# Patient Record
Sex: Male | Born: 1935
Health system: Southern US, Community
[De-identification: ages and names within clinical notes are randomized; demographics above are authoritative.]

## PROBLEM LIST (undated history)

## (undated) DIAGNOSIS — I10 Essential (primary) hypertension: Secondary | ICD-10-CM

## (undated) DIAGNOSIS — N400 Enlarged prostate without lower urinary tract symptoms: Secondary | ICD-10-CM

## (undated) DIAGNOSIS — H349 Unspecified retinal vascular occlusion: Secondary | ICD-10-CM

## (undated) DIAGNOSIS — Z973 Presence of spectacles and contact lenses: Secondary | ICD-10-CM

## (undated) DIAGNOSIS — Z974 Presence of external hearing-aid: Secondary | ICD-10-CM

## (undated) DIAGNOSIS — M1990 Unspecified osteoarthritis, unspecified site: Secondary | ICD-10-CM

## (undated) DIAGNOSIS — Z972 Presence of dental prosthetic device (complete) (partial): Secondary | ICD-10-CM

## (undated) HISTORY — PX: COLONOSCOPY: SHX174

## (undated) HISTORY — PX: HEEL SPUR SURGERY: SHX665

## (undated) HISTORY — PX: TONSILLECTOMY: SUR1361

## (undated) HISTORY — PX: VASECTOMY: SHX75

## (undated) HISTORY — PX: HERNIA REPAIR: SHX51

## (undated) HISTORY — PX: EYE SURGERY: SHX253

---

## 1988-12-09 HISTORY — PX: FOOT SURGERY: SHX648

## 1990-12-09 HISTORY — PX: TOTAL KNEE ARTHROPLASTY: SHX125

## 2000-01-08 ENCOUNTER — Other Ambulatory Visit: Admission: RE | Admit: 2000-01-08 | Discharge: 2000-01-08 | Payer: Self-pay | Admitting: Urology

## 2002-07-06 ENCOUNTER — Encounter: Admission: RE | Admit: 2002-07-06 | Discharge: 2002-07-06 | Payer: Self-pay | Admitting: Surgery

## 2002-07-06 ENCOUNTER — Encounter: Payer: Self-pay | Admitting: Surgery

## 2002-07-08 ENCOUNTER — Ambulatory Visit (HOSPITAL_BASED_OUTPATIENT_CLINIC_OR_DEPARTMENT_OTHER): Admission: RE | Admit: 2002-07-08 | Discharge: 2002-07-08 | Payer: Self-pay | Admitting: Surgery

## 2002-07-09 ENCOUNTER — Emergency Department (HOSPITAL_COMMUNITY): Admission: EM | Admit: 2002-07-09 | Discharge: 2002-07-09 | Payer: Self-pay | Admitting: Emergency Medicine

## 2004-06-23 ENCOUNTER — Emergency Department (HOSPITAL_COMMUNITY): Admission: EM | Admit: 2004-06-23 | Discharge: 2004-06-23 | Payer: Self-pay | Admitting: Emergency Medicine

## 2004-11-22 ENCOUNTER — Encounter: Admission: RE | Admit: 2004-11-22 | Discharge: 2004-11-22 | Payer: Self-pay | Admitting: Surgery

## 2004-11-26 ENCOUNTER — Ambulatory Visit (HOSPITAL_BASED_OUTPATIENT_CLINIC_OR_DEPARTMENT_OTHER): Admission: RE | Admit: 2004-11-26 | Discharge: 2004-11-26 | Payer: Self-pay | Admitting: Surgery

## 2004-11-26 ENCOUNTER — Ambulatory Visit (HOSPITAL_COMMUNITY): Admission: RE | Admit: 2004-11-26 | Discharge: 2004-11-26 | Payer: Self-pay | Admitting: Surgery

## 2004-11-26 ENCOUNTER — Encounter (INDEPENDENT_AMBULATORY_CARE_PROVIDER_SITE_OTHER): Payer: Self-pay | Admitting: *Deleted

## 2005-09-17 IMAGING — CR DG CHEST 2V
2 series · 2 of 2 positions shown · non-contrast
Comparison: none

CLINICAL DATA: Pre-op for right inguinal hernia repair. 
 CHEST X-RAY: 
 Two views of the chest show the lungs to b clear.  The heart is within upper limits of normal.  The descending thoracic aorta is ectatic.  There are degenerative changes in the mid to lower thoracic spine.

[view not recorded (1 of 2)]
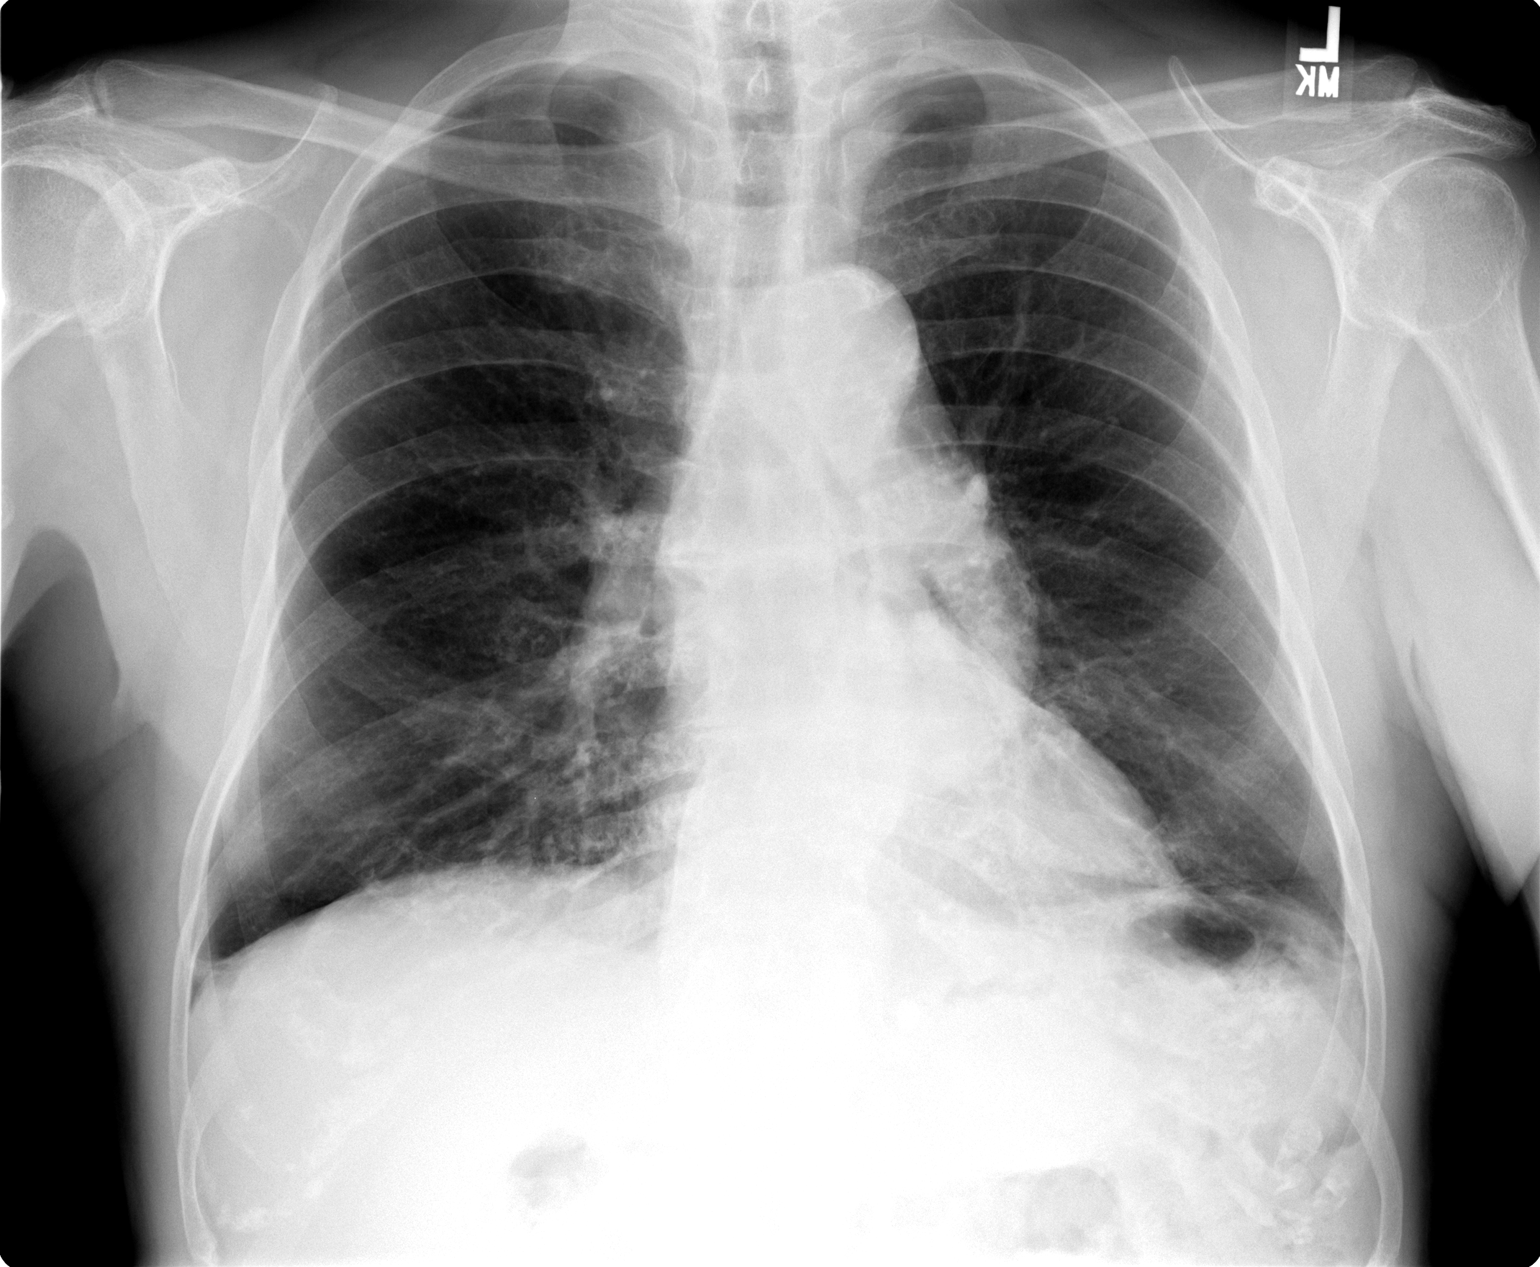

[view not recorded (2 of 2)]
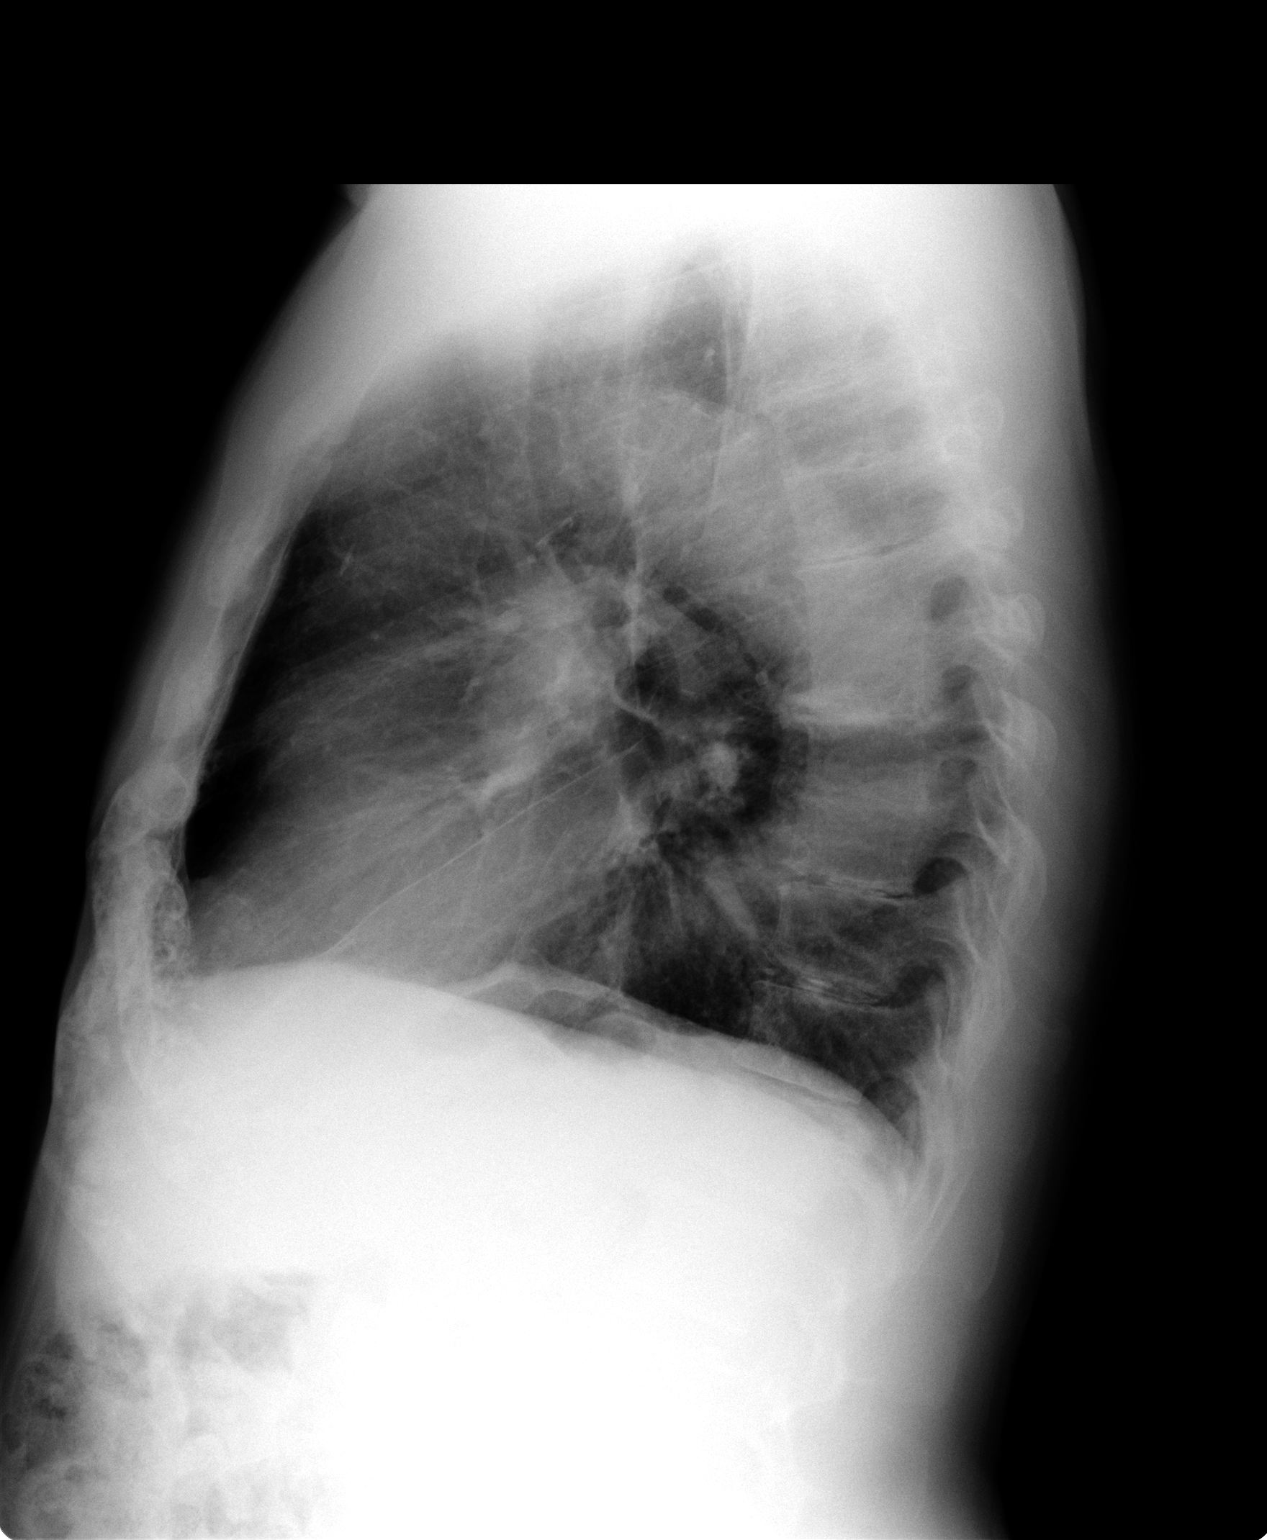

[2 of 2 positions shown; findings below may reference images not displayed]

IMPRESSION: No active lung disease.

## 2008-06-14 ENCOUNTER — Ambulatory Visit (HOSPITAL_COMMUNITY): Admission: RE | Admit: 2008-06-14 | Discharge: 2008-06-14 | Payer: Self-pay | Admitting: Ophthalmology

## 2008-06-14 ENCOUNTER — Encounter (INDEPENDENT_AMBULATORY_CARE_PROVIDER_SITE_OTHER): Payer: Self-pay | Admitting: Ophthalmology

## 2008-06-14 ENCOUNTER — Ambulatory Visit: Payer: Self-pay | Admitting: Vascular Surgery

## 2011-04-26 NOTE — Op Note (Signed)
NAME:  Kenneth Fleming, Kenneth Fleming                  ACCOUNT NO.:  [id number]   MEDICAL RECORD NO.:  [id number]          PATIENT TYPE:  AMB   LOCATION:  DSC                          FACILITY:  [facility]   PHYSICIAN:  Currie Paris, M.D.DATE OF BIRTH:  04/10/1926   DATE OF PROCEDURE:  11/26/2004  DATE OF DISCHARGE:                                 OPERATIVE REPORT   CCS [phone]   PREOPERATIVE DIAGNOSIS:  Right inguinal hernia.   POSTOPERATIVE DIAGNOSIS:  Right inguinal hernia--direct and indirect.   OPERATION PERFORMED:  Repair of right inguinal hernia with mesh.   SURGEON:  Currie Paris, M.D.   ASSISTANT:  Annamarie Dawley, PA-student.   ANESTHESIA:  MAC.   INDICATIONS FOR PROCEDURE:  The patient is a 77 year old gentleman who  presented to my office recently with a right inguinal hernia.  He desired to  have this fixed.   DESCRIPTION OF PROCEDURE:  The patient was seen in the holding areas and had  no further questions.  The right inguinal area was marked by the patient and  myself as the operative site.  He was taken to the operating room and after  being given IV sedation, the area was clipped, prepped and draped.  Combination of 1% Xylocaine plus 0.5% Marcaine with epinephrine was mixed  equally and used for local.  I infiltrated along the incision line and then  just medial to the anterior superior iliac spine subfascially.  The incision  was made and deepened to the external oblique aponeurosis with bleeders  either coagulated or tied.  The external obliquewas opened in line with its  fibers and elevated off the underlying transversalis  and cord.  The cord  was elevated off the floor and surrounded with a Penrose drain.  There was  an obvious small direct defect with some preperitoneal fat protruding  through.  The cord was skeletonized somewhat and an indirect sac identified,  stripped off, twisted, suture ligated and amputated and it retracted neatly  into the deep ring.  The  deep ring was fairly patulous so I put a 2-0  Prolene to close it placing this lateral to the cord.  I then took a piece  of mesh and cut it tapering it at the middle, squared it off laterally and  split lateral to go around the cord, laid it over the entire inguinal floor  and sutured it in with a running 2-0 Prolene starting at the pubic tubercle  and going to the deep ring and then tacking it well under the external  oblique aponeurosis and onto the internal oblique.  The tails were crossed  and went well lateral to reconstruct the deep ring.  Once everything  was laid in nicely and sutured down, I checked for hemostasis and everything  was dry.  The incision was closed with 3-0 Vicryl on external oblique, 3-0  Vicryl in Scarpa's, 4-0 Monocryl subcuticular and Dermabond on the skin.   The patient tolerated the procedure well.  There were no operative  complications.  All counts were correct.      Chri  CJS/MEDQ  D:  11/26/2004  T:  11/27/2004  Job:  [id number]   cc:   Wilson Singer, M.D.  [address]  [place]  [place] [address]  Fax: [phone]

## 2011-04-26 NOTE — Op Note (Signed)
   TNAMEABDALLAH, HERN                           ACCOUNT NO.:  [id number]   MEDICAL RECORD NO.:  [id number]                   PATIENT TYPE:  EMS   LOCATION:  ED                                   FACILITY:  [facility]   PHYSICIAN:  Currie Paris, M.D.           DATE OF BIRTH:  [birth year]   DATE OF PROCEDURE:  07/08/2002  DATE OF DISCHARGE:  07/09/2002                                 OPERATIVE REPORT   CCS# [id number]   PREOPERATIVE DIAGNOSES:  Umbilical hernia.   POSTOPERATIVE DIAGNOSES:  Umbilical hernia.   OPERATION PERFORMED:  Repair of umbilical hernia with mesh.   SURGEON:  Currie Paris, M.D.   ANESTHESIA:  General.   INDICATIONS FOR PROCEDURE:  The patient is a 77 year old gentleman with  gradually enlarging umbilical hernia which he wished to have repaired.   DESCRIPTION OF PROCEDURE:  The patient was seen in the holding area and had  no further questions.  The umbilical hernia was identified as the operative  site.  The patient was taken to the operating room and after satisfactory  general anesthesia had been obtained, the abdomen around the umbilicus was  prepped and draped as a sterile field.  I injected a combination of 1%  Xylocaine with epinephrine and 0.5% plain Marcaine local anesthesia to have  the area anesthetized so that we would have less postoperative pain.  A  curvilinear incision was made at the umbilical base and the skin elevated  off the hernia sac.  The hernia sac was freed up off of the surrounding  fascial edges and the hernia reduced.  It only contained a little  preperitoneal fat or omentum.  The repair was about 1 cm to 1.5 cm in size.   I took a small piece of mesh and fashioned it into a plug and held it in  place and closed the defect with there sutures of 0 Prolene incorporating  the mesh into each suture.  These tied down easily.  I felt the mesh would  have help reinforce them.  There was no bleeding.  The subcu was closed with  sterile Vicryl and the skin with 4-0 Monocryl subcuticular plus Steri-  Strips.  The patient tolerated the procedure well.  There were no operative  complications.  All counts were correct.                                               Currie Paris, M.D.    CJS/MEDQ  D:  07/08/2002  T:  07/13/2002  Job:  [id number]

## 2011-12-11 DIAGNOSIS — J310 Chronic rhinitis: Secondary | ICD-10-CM | POA: Diagnosis not present

## 2011-12-11 DIAGNOSIS — N400 Enlarged prostate without lower urinary tract symptoms: Secondary | ICD-10-CM | POA: Diagnosis not present

## 2011-12-11 DIAGNOSIS — I10 Essential (primary) hypertension: Secondary | ICD-10-CM | POA: Diagnosis not present

## 2011-12-11 DIAGNOSIS — E785 Hyperlipidemia, unspecified: Secondary | ICD-10-CM | POA: Diagnosis not present

## 2011-12-17 DIAGNOSIS — H34239 Retinal artery branch occlusion, unspecified eye: Secondary | ICD-10-CM | POA: Diagnosis not present

## 2011-12-17 DIAGNOSIS — H4720 Unspecified optic atrophy: Secondary | ICD-10-CM | POA: Diagnosis not present

## 2011-12-17 DIAGNOSIS — H3410 Central retinal artery occlusion, unspecified eye: Secondary | ICD-10-CM | POA: Diagnosis not present

## 2012-03-12 DIAGNOSIS — N401 Enlarged prostate with lower urinary tract symptoms: Secondary | ICD-10-CM | POA: Diagnosis not present

## 2012-03-18 DIAGNOSIS — N401 Enlarged prostate with lower urinary tract symptoms: Secondary | ICD-10-CM | POA: Diagnosis not present

## 2012-03-18 DIAGNOSIS — R972 Elevated prostate specific antigen [PSA]: Secondary | ICD-10-CM | POA: Diagnosis not present

## 2012-03-28 DIAGNOSIS — M25569 Pain in unspecified knee: Secondary | ICD-10-CM | POA: Diagnosis not present

## 2012-03-28 DIAGNOSIS — M545 Low back pain: Secondary | ICD-10-CM | POA: Diagnosis not present

## 2012-04-22 DIAGNOSIS — M545 Low back pain: Secondary | ICD-10-CM | POA: Diagnosis not present

## 2012-04-22 DIAGNOSIS — M1710 Unilateral primary osteoarthritis, unspecified knee: Secondary | ICD-10-CM | POA: Diagnosis not present

## 2012-05-20 DIAGNOSIS — M1710 Unilateral primary osteoarthritis, unspecified knee: Secondary | ICD-10-CM | POA: Diagnosis not present

## 2012-05-20 DIAGNOSIS — M545 Low back pain: Secondary | ICD-10-CM | POA: Diagnosis not present

## 2012-05-20 DIAGNOSIS — M25579 Pain in unspecified ankle and joints of unspecified foot: Secondary | ICD-10-CM | POA: Diagnosis not present

## 2012-05-22 DIAGNOSIS — H4720 Unspecified optic atrophy: Secondary | ICD-10-CM | POA: Diagnosis not present

## 2012-05-22 DIAGNOSIS — H3410 Central retinal artery occlusion, unspecified eye: Secondary | ICD-10-CM | POA: Diagnosis not present

## 2012-05-26 DIAGNOSIS — M545 Low back pain: Secondary | ICD-10-CM | POA: Diagnosis not present

## 2012-05-27 DIAGNOSIS — M1710 Unilateral primary osteoarthritis, unspecified knee: Secondary | ICD-10-CM | POA: Diagnosis not present

## 2012-05-29 DIAGNOSIS — M545 Low back pain: Secondary | ICD-10-CM | POA: Diagnosis not present

## 2012-06-01 DIAGNOSIS — M545 Low back pain: Secondary | ICD-10-CM | POA: Diagnosis not present

## 2012-06-03 DIAGNOSIS — M1710 Unilateral primary osteoarthritis, unspecified knee: Secondary | ICD-10-CM | POA: Diagnosis not present

## 2012-06-03 DIAGNOSIS — M545 Low back pain: Secondary | ICD-10-CM | POA: Diagnosis not present

## 2012-06-04 DIAGNOSIS — R011 Cardiac murmur, unspecified: Secondary | ICD-10-CM | POA: Diagnosis not present

## 2012-06-04 DIAGNOSIS — I10 Essential (primary) hypertension: Secondary | ICD-10-CM | POA: Diagnosis not present

## 2012-06-04 DIAGNOSIS — N400 Enlarged prostate without lower urinary tract symptoms: Secondary | ICD-10-CM | POA: Diagnosis not present

## 2012-06-05 DIAGNOSIS — N400 Enlarged prostate without lower urinary tract symptoms: Secondary | ICD-10-CM | POA: Diagnosis not present

## 2012-06-05 DIAGNOSIS — R011 Cardiac murmur, unspecified: Secondary | ICD-10-CM | POA: Diagnosis not present

## 2012-06-05 DIAGNOSIS — M545 Low back pain: Secondary | ICD-10-CM | POA: Diagnosis not present

## 2012-06-05 DIAGNOSIS — I10 Essential (primary) hypertension: Secondary | ICD-10-CM | POA: Diagnosis not present

## 2012-06-10 DIAGNOSIS — M1710 Unilateral primary osteoarthritis, unspecified knee: Secondary | ICD-10-CM | POA: Diagnosis not present

## 2012-06-22 DIAGNOSIS — H4720 Unspecified optic atrophy: Secondary | ICD-10-CM | POA: Diagnosis not present

## 2012-06-22 DIAGNOSIS — H3410 Central retinal artery occlusion, unspecified eye: Secondary | ICD-10-CM | POA: Diagnosis not present

## 2012-06-22 DIAGNOSIS — H34239 Retinal artery branch occlusion, unspecified eye: Secondary | ICD-10-CM | POA: Diagnosis not present

## 2012-06-24 DIAGNOSIS — M545 Low back pain: Secondary | ICD-10-CM | POA: Diagnosis not present

## 2012-06-24 DIAGNOSIS — M1710 Unilateral primary osteoarthritis, unspecified knee: Secondary | ICD-10-CM | POA: Diagnosis not present

## 2012-07-04 DIAGNOSIS — M5137 Other intervertebral disc degeneration, lumbosacral region: Secondary | ICD-10-CM | POA: Diagnosis not present

## 2012-07-08 DIAGNOSIS — H4720 Unspecified optic atrophy: Secondary | ICD-10-CM | POA: Diagnosis not present

## 2012-07-08 DIAGNOSIS — H34239 Retinal artery branch occlusion, unspecified eye: Secondary | ICD-10-CM | POA: Diagnosis not present

## 2012-07-08 DIAGNOSIS — Z961 Presence of intraocular lens: Secondary | ICD-10-CM | POA: Diagnosis not present

## 2012-07-08 DIAGNOSIS — H0230 Blepharochalasis unspecified eye, unspecified eyelid: Secondary | ICD-10-CM | POA: Diagnosis not present

## 2012-07-22 DIAGNOSIS — IMO0002 Reserved for concepts with insufficient information to code with codable children: Secondary | ICD-10-CM | POA: Diagnosis not present

## 2012-08-04 DIAGNOSIS — IMO0002 Reserved for concepts with insufficient information to code with codable children: Secondary | ICD-10-CM | POA: Diagnosis not present

## 2012-08-25 DIAGNOSIS — M48061 Spinal stenosis, lumbar region without neurogenic claudication: Secondary | ICD-10-CM | POA: Diagnosis not present

## 2012-09-14 DIAGNOSIS — M545 Low back pain: Secondary | ICD-10-CM | POA: Diagnosis not present

## 2012-09-14 DIAGNOSIS — M25569 Pain in unspecified knee: Secondary | ICD-10-CM | POA: Diagnosis not present

## 2012-09-15 DIAGNOSIS — M47817 Spondylosis without myelopathy or radiculopathy, lumbosacral region: Secondary | ICD-10-CM | POA: Diagnosis not present

## 2012-10-07 DIAGNOSIS — IMO0002 Reserved for concepts with insufficient information to code with codable children: Secondary | ICD-10-CM | POA: Diagnosis not present

## 2012-10-23 DIAGNOSIS — E785 Hyperlipidemia, unspecified: Secondary | ICD-10-CM | POA: Diagnosis not present

## 2012-10-23 DIAGNOSIS — Z1331 Encounter for screening for depression: Secondary | ICD-10-CM | POA: Diagnosis not present

## 2012-10-23 DIAGNOSIS — I10 Essential (primary) hypertension: Secondary | ICD-10-CM | POA: Diagnosis not present

## 2012-10-23 DIAGNOSIS — Z23 Encounter for immunization: Secondary | ICD-10-CM | POA: Diagnosis not present

## 2012-10-23 DIAGNOSIS — Z0000 Encounter for general adult medical examination without abnormal findings: Secondary | ICD-10-CM | POA: Diagnosis not present

## 2012-11-11 DIAGNOSIS — M25569 Pain in unspecified knee: Secondary | ICD-10-CM | POA: Diagnosis not present

## 2012-11-24 ENCOUNTER — Other Ambulatory Visit: Payer: Self-pay | Admitting: Orthopaedic Surgery

## 2012-12-08 ENCOUNTER — Other Ambulatory Visit: Payer: Self-pay | Admitting: Orthopaedic Surgery

## 2012-12-08 ENCOUNTER — Encounter (HOSPITAL_COMMUNITY): Payer: Self-pay | Admitting: Pharmacy Technician

## 2012-12-16 ENCOUNTER — Encounter (HOSPITAL_COMMUNITY)
Admission: RE | Admit: 2012-12-16 | Discharge: 2012-12-16 | Disposition: A | Discharge status: Home / Self Care | Payer: Medicare Other | Source: Ambulatory Visit | Attending: Orthopaedic Surgery | Admitting: Orthopaedic Surgery

## 2012-12-16 ENCOUNTER — Encounter (HOSPITAL_COMMUNITY): Payer: Self-pay

## 2012-12-16 ENCOUNTER — Ambulatory Visit (HOSPITAL_COMMUNITY)
Admission: RE | Admit: 2012-12-16 | Discharge: 2012-12-16 | Disposition: A | Discharge status: Home / Self Care | Payer: Medicare Other | Source: Ambulatory Visit | Attending: Orthopaedic Surgery | Admitting: Orthopaedic Surgery

## 2012-12-16 DIAGNOSIS — I517 Cardiomegaly: Secondary | ICD-10-CM | POA: Diagnosis not present

## 2012-12-16 DIAGNOSIS — Z01812 Encounter for preprocedural laboratory examination: Secondary | ICD-10-CM | POA: Insufficient documentation

## 2012-12-16 DIAGNOSIS — Z01818 Encounter for other preprocedural examination: Secondary | ICD-10-CM | POA: Insufficient documentation

## 2012-12-16 DIAGNOSIS — Z01810 Encounter for preprocedural cardiovascular examination: Secondary | ICD-10-CM | POA: Insufficient documentation

## 2012-12-16 HISTORY — DX: Unspecified retinal vascular occlusion: H34.9

## 2012-12-16 HISTORY — DX: Unspecified osteoarthritis, unspecified site: M19.90

## 2012-12-16 HISTORY — DX: Essential (primary) hypertension: I10

## 2012-12-16 LAB — URINALYSIS, ROUTINE W REFLEX MICROSCOPIC
Glucose, UA: NEGATIVE mg/dL
Hgb urine dipstick: NEGATIVE
Leukocytes, UA: NEGATIVE
pH: 6 (ref 5.0–8.0)

## 2012-12-16 LAB — CBC WITH DIFFERENTIAL/PLATELET
Basophils Absolute: 0.1 10*3/uL (ref 0.0–0.1)
Basophils Relative: 1 % (ref 0–1)
Eosinophils Absolute: 0.2 10*3/uL (ref 0.0–0.7)
HCT: 37.3 % — ABNORMAL LOW (ref 39.0–52.0)
Hemoglobin: 11.7 g/dL — ABNORMAL LOW (ref 13.0–17.0)
MCH: 25 pg — ABNORMAL LOW (ref 26.0–34.0)
MCHC: 31.4 g/dL (ref 30.0–36.0)
Monocytes Absolute: 0.6 10*3/uL (ref 0.1–1.0)
Monocytes Relative: 9 % (ref 3–12)
Neutro Abs: 3.4 10*3/uL (ref 1.7–7.7)
Neutrophils Relative %: 57 % (ref 43–77)
RDW: 17 % — ABNORMAL HIGH (ref 11.5–15.5)

## 2012-12-16 LAB — BASIC METABOLIC PANEL
BUN: 18 mg/dL (ref 6–23)
Creatinine, Ser: 0.93 mg/dL (ref 0.50–1.35)
GFR calc Af Amer: 87 mL/min — ABNORMAL LOW (ref >90)
GFR calc non Af Amer: 75 mL/min — ABNORMAL LOW (ref >90)
Glucose, Bld: 96 mg/dL (ref 70–99)
Potassium: 4.4 mEq/L (ref 3.5–5.1)

## 2012-12-16 LAB — ABO/RH: ABO/RH(D): O NEG

## 2012-12-16 LAB — SURGICAL PCR SCREEN
MRSA, PCR: NEGATIVE
Staphylococcus aureus: NEGATIVE

## 2012-12-16 MED ORDER — CHLORHEXIDINE GLUCONATE 4 % EX LIQD: 60.0000 mL | Freq: Once | CUTANEOUS | Status: DC

## 2012-12-16 NOTE — Pre-Procedure Instructions (Signed)
20 Kenneth Fleming  12/16/2012   Your procedure is scheduled on:  Tuesday December 22, 2012  Report to [facility] at 8:15 AM.  Call this number if you have problems the morning of surgery: [phone]   Remember:   Do not eat food or drink :After Midnight.    Take these medicines the morning of surgery with A SIP OF WATER: tylenol (if needed), azelastine, flomax, finasteride   Do not wear jewelry, make-up or nail polish.  Do not wear lotions, powders, or perfumes.   Do not shave 48 hours prior to surgery. Men may shave face and neck.  Do not bring valuables to the hospital.  Contacts, dentures or bridgework may not be worn into surgery.  Leave suitcase in the car. After surgery it may be brought to your room.  For patients admitted to the hospital, checkout time is 11:00 AM the day of discharge.   Patients discharged the day of surgery will not be allowed to drive home.  Name and phone number of your driver: family / friend  Special Instructions: Shower using CHG 2 nights before surgery and the night before surgery.  If you shower the day of surgery use CHG.  Use special wash - you have one bottle of CHG for all showers.  You should use approximately 1/3 of the bottle for each shower.   Please read over the following fact sheets that you were given: Pain Booklet, Coughing and Deep Breathing, Blood Transfusion Information, Total Joint Packet, MRSA Information and Surgical Site Infection Prevention

## 2012-12-16 NOTE — Progress Notes (Addendum)
Contacted patients primary's office, Dr. Kirby Funk, spoke with [name].requested copy of EKG.  Forwarded to anesthesia for review.

## 2012-12-18 NOTE — Consult Note (Signed)
Anesthesia Chart Review:  Patient is a 79 year old male scheduled for left TKA on 12/22/12 by Dr. Jerl Santos.  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  PCP is Dr. Kirby Funk, last visit on 10/23/12.  EKG on 12/16/12 showed NSR, right BBB, LAFB, bifascicular block.  Overall, his EKG appears stable since at least 11/22/04 (see Muse).    Echo on 06/05/12 showed mild concentric LVH with moderate septal hypertrophy, hyperdynamic LV contraction, no regional wall motion abnormalities, LVEF 70-75%, normal MV struction with systolic anterior motion of the MV with associated turbulent flow (murmur).  Mildly increased LV outflow tract velocity.  Mild MR/TR.  Mildly elevated estimated RV systolic pressure.  Mild aortic root dilatation, 4.0 cm at the sinus of Valsalva.  Doppler findings suggestive of grade I diastolic dysfunction without elevated LA pressures.  CXR on 12/16/12 showed cardiomegaly without congestive failure.  Preoperative labs noted.  Patient with stable EKG since 2005, normal/hyperdynamic EF by recent echo.  He has no documented history of CAD/MI/CHF or DM.  No CV symptoms were documented at PAT.  If remains asymptomatic from a CV standpoint then would anticipate he could proceed as planned.  Anesthesiologist Dr. Noreene Larsson agrees with this plan.  Shonna Chock, PA-C 12/18/12 1800

## 2012-12-18 NOTE — H&P (Signed)
TOTAL KNEE ADMISSION H&P  Patient is being admitted for left total knee arthroplasty.  Subjective:  Chief Complaint:left knee pain.  HPI: Kenneth Fleming, 79 y.o. male, has a history of pain and functional disability in the left knee due to arthritis and has failed non-surgical conservative treatments for greater than 12 weeks to includeNSAID's and/or analgesics, corticosteriod injections, viscosupplementation injections and activity modification.  Onset of symptoms was gradual, starting 6 years ago with gradually worsening course since that time. The patient noted no past surgery on the left knee(s).  Patient currently rates pain in the left knee(s) at 8 out of 10 with activity. Patient has night pain, worsening of pain with activity and weight bearing, pain that interferes with activities of daily living, pain with passive range of motion and crepitus.  Patient has evidence of subchondral sclerosis, periarticular osteophytes and joint space narrowing by imaging studies. This patient has had no previous surgery. There is no active infection.  There are no active problems to display for this patient.  Past Medical History  Diagnosis Date  . Retinal vascular occlusion of right eye     hx of  . Arthritis   . Hypertension     sees Dr. Kirby Funk [phone]    Past Surgical History  Procedure Date  . Total knee arthroplasty     right knee  . Foot surgery     left  . Eye surgery     bilaterally  . Hernia repair     bilateral inguinal, and umbilical  . Vasectomy     No prescriptions prior to admission   No Known Allergies  History  Substance Use Topics  . Smoking status: Former Games developer  . Smokeless tobacco: Not on file     Comment: stopped smoking "1975"  . Alcohol Use: Yes     Comment: "occa"    No family history on file.   Review of Systems  Constitutional: Negative.   HENT: Negative.   Eyes: Negative.   Respiratory: Negative.   Cardiovascular: Negative.     Gastrointestinal: Negative.   Genitourinary: Negative.   Musculoskeletal: Positive for joint pain.  Skin: Negative.   Neurological: Negative.   Endo/Heme/Allergies: Negative.   Psychiatric/Behavioral: Negative.     Objective:  Physical Exam  Constitutional: He appears well-nourished.  HENT:  Head: Normocephalic.  Eyes: Pupils are equal, round, and reactive to light.  Neck: Normal range of motion.  Cardiovascular: Normal rate and regular rhythm.   Respiratory: Breath sounds normal.  GI: Bowel sounds are normal.  Musculoskeletal:       Left knee exam: Walks with an altered gait.  Range of motion 5-1 20.  Crepitation 1+.  Pain along the medial joint line.  Neurological: He is alert.  Skin: Skin is dry.  Psychiatric: He has a normal mood and affect.    Vital signs in last 24 hours:    Labs:   There is no height or weight on file to calculate BMI.   Imaging Review Plain radiographs demonstrate severe degenerative joint disease of the left knee(s). The overall alignment isneutral. The bone quality appears to be good for age and reported activity level.  Assessment/Plan:  End stage arthritis, left knee   The patient history, physical examination, clinical judgment of the provider and imaging studies are consistent with end stage degenerative joint disease of the left knee(s) and total knee arthroplasty is deemed medically necessary. The treatment options including medical management, injection therapy arthroscopy and arthroplasty were discussed  at length. The risks and benefits of total knee arthroplasty were presented and reviewed. The risks due to aseptic loosening, infection, stiffness, patella tracking problems, thromboembolic complications and other imponderables were discussed. The patient acknowledged the explanation, agreed to proceed with the plan and consent was signed. Patient is being admitted for inpatient treatment for surgery, pain control, PT, OT, prophylactic  antibiotics, VTE prophylaxis, progressive ambulation and ADL's and discharge planning. The patient is planning to be discharged home with home health services

## 2012-12-21 MED ORDER — CEFAZOLIN SODIUM-DEXTROSE 2-3 GM-% IV SOLR: 2.0000 g | INTRAVENOUS | Status: DC

## 2012-12-22 ENCOUNTER — Inpatient Hospital Stay (HOSPITAL_COMMUNITY)
Admission: RE | Admit: 2012-12-22 | Discharge: 2012-12-25 | DRG: 470 | Disposition: A | Discharge status: Home Health | Payer: Medicare Other | Source: Ambulatory Visit | Attending: Orthopaedic Surgery | Admitting: Orthopaedic Surgery

## 2012-12-22 ENCOUNTER — Encounter (HOSPITAL_COMMUNITY)
Admission: RE | Disposition: A | Discharge status: Home Health | Payer: Self-pay | Source: Ambulatory Visit | Attending: Orthopaedic Surgery

## 2012-12-22 ENCOUNTER — Encounter (HOSPITAL_COMMUNITY): Payer: Self-pay | Admitting: Vascular Surgery

## 2012-12-22 ENCOUNTER — Encounter (HOSPITAL_COMMUNITY): Payer: Self-pay | Admitting: *Deleted

## 2012-12-22 ENCOUNTER — Inpatient Hospital Stay (HOSPITAL_COMMUNITY): Payer: Medicare Other | Admitting: Vascular Surgery

## 2012-12-22 DIAGNOSIS — G8918 Other acute postprocedural pain: Secondary | ICD-10-CM | POA: Diagnosis not present

## 2012-12-22 DIAGNOSIS — IMO0002 Reserved for concepts with insufficient information to code with codable children: Secondary | ICD-10-CM | POA: Diagnosis not present

## 2012-12-22 DIAGNOSIS — Z87891 Personal history of nicotine dependence: Secondary | ICD-10-CM | POA: Diagnosis not present

## 2012-12-22 DIAGNOSIS — I10 Essential (primary) hypertension: Secondary | ICD-10-CM | POA: Diagnosis not present

## 2012-12-22 DIAGNOSIS — M1710 Unilateral primary osteoarthritis, unspecified knee: Secondary | ICD-10-CM | POA: Diagnosis not present

## 2012-12-22 DIAGNOSIS — Z79899 Other long term (current) drug therapy: Secondary | ICD-10-CM

## 2012-12-22 DIAGNOSIS — M1712 Unilateral primary osteoarthritis, left knee: Secondary | ICD-10-CM

## 2012-12-22 DIAGNOSIS — Z7982 Long term (current) use of aspirin: Secondary | ICD-10-CM | POA: Diagnosis not present

## 2012-12-22 DIAGNOSIS — Z96659 Presence of unspecified artificial knee joint: Secondary | ICD-10-CM | POA: Diagnosis not present

## 2012-12-22 HISTORY — PX: TOTAL KNEE ARTHROPLASTY: SHX125

## 2012-12-22 SURGERY — ARTHROPLASTY, KNEE, TOTAL
Anesthesia: General | Site: Knee | Laterality: Left | Wound class: Clean

## 2012-12-22 MED ORDER — ACETAMINOPHEN 10 MG/ML IV SOLN
1000.0000 mg | Freq: Four times a day (QID) | INTRAVENOUS | Status: AC
  Administered 2012-12-22 – 2012-12-23 (×3): 1000 mg via INTRAVENOUS
  Filled 2012-12-22 (×4): qty 100

## 2012-12-22 MED ORDER — ASPIRIN EC 325 MG PO TBEC
325.0000 mg | DELAYED_RELEASE_TABLET | Freq: Two times a day (BID) | ORAL | Status: DC
  Administered 2012-12-23 – 2012-12-25 (×5): 325 mg via ORAL
  Filled 2012-12-22 (×7): qty 1

## 2012-12-22 MED ORDER — ALUM & MAG HYDROXIDE-SIMETH 200-200-20 MG/5ML PO SUSP: 30.0000 mL | ORAL | Status: DC | PRN

## 2012-12-22 MED ORDER — AZELASTINE HCL 0.1 % NA SOLN
2.0000 | Freq: Every day | NASAL | Status: DC
  Administered 2012-12-23 – 2012-12-25 (×3): 2 via NASAL
  Filled 2012-12-22: qty 30

## 2012-12-22 MED ORDER — DIPHENHYDRAMINE HCL 12.5 MG/5ML PO ELIX: 12.5000 mg | ORAL_SOLUTION | ORAL | Status: DC | PRN

## 2012-12-22 MED ORDER — METHOCARBAMOL 500 MG PO TABS: 500.0000 mg | ORAL_TABLET | Freq: Four times a day (QID) | ORAL | Status: DC | PRN

## 2012-12-22 MED ORDER — ONDANSETRON HCL 4 MG/2ML IJ SOLN: 4.0000 mg | Freq: Four times a day (QID) | INTRAMUSCULAR | Status: DC | PRN

## 2012-12-22 MED ORDER — FENTANYL CITRATE 0.05 MG/ML IJ SOLN
INTRAMUSCULAR | Status: DC | PRN
  Administered 2012-12-22 (×2): 100 ug via INTRAVENOUS
  Administered 2012-12-22 (×2): 50 ug via INTRAVENOUS

## 2012-12-22 MED ORDER — ZOLPIDEM TARTRATE 5 MG PO TABS: 5.0000 mg | ORAL_TABLET | Freq: Every evening | ORAL | Status: DC | PRN

## 2012-12-22 MED ORDER — MEPERIDINE HCL 25 MG/ML IJ SOLN: 6.2500 mg | INTRAMUSCULAR | Status: DC | PRN

## 2012-12-22 MED ORDER — OXYCODONE HCL 5 MG PO TABS: 5.0000 mg | ORAL_TABLET | Freq: Once | ORAL | Status: DC | PRN

## 2012-12-22 MED ORDER — CEFAZOLIN SODIUM-DEXTROSE 2-3 GM-% IV SOLR
2.0000 g | Freq: Four times a day (QID) | INTRAVENOUS | Status: AC
  Administered 2012-12-22 (×2): 2 g via INTRAVENOUS
  Filled 2012-12-22 (×2): qty 50

## 2012-12-22 MED ORDER — CEFAZOLIN SODIUM-DEXTROSE 2-3 GM-% IV SOLR
INTRAVENOUS | Status: AC
  Administered 2012-12-22: 2 g via INTRAVENOUS
  Filled 2012-12-22: qty 50

## 2012-12-22 MED ORDER — FENTANYL CITRATE 0.05 MG/ML IJ SOLN
INTRAMUSCULAR | Status: AC
  Administered 2012-12-22: 100 ug
  Filled 2012-12-22: qty 2

## 2012-12-22 MED ORDER — SODIUM CHLORIDE 0.9 % IR SOLN
Status: DC | PRN
  Administered 2012-12-22: 3000 mL

## 2012-12-22 MED ORDER — EPHEDRINE SULFATE 50 MG/ML IJ SOLN
INTRAMUSCULAR | Status: DC | PRN
  Administered 2012-12-22: 5 mg via INTRAVENOUS

## 2012-12-22 MED ORDER — HYDROMORPHONE HCL PF 1 MG/ML IJ SOLN
0.5000 mg | INTRAMUSCULAR | Status: DC | PRN
  Administered 2012-12-22: 0.5 mg via INTRAVENOUS

## 2012-12-22 MED ORDER — LACTATED RINGERS IV SOLN
INTRAVENOUS | Status: DC
  Administered 2012-12-22: 09:00:00 via INTRAVENOUS

## 2012-12-22 MED ORDER — LIDOCAINE HCL (CARDIAC) 20 MG/ML IV SOLN
INTRAVENOUS | Status: DC | PRN
  Administered 2012-12-22: 20 mg via INTRAVENOUS

## 2012-12-22 MED ORDER — BENAZEPRIL HCL 10 MG PO TABS
10.0000 mg | ORAL_TABLET | Freq: Every day | ORAL | Status: DC
  Administered 2012-12-23 – 2012-12-25 (×2): 10 mg via ORAL
  Filled 2012-12-22 (×3): qty 1

## 2012-12-22 MED ORDER — PHENOL 1.4 % MT LIQD: 1.0000 | OROMUCOSAL | Status: DC | PRN

## 2012-12-22 MED ORDER — HYDROMORPHONE HCL PF 1 MG/ML IJ SOLN
0.5000 mg | INTRAMUSCULAR | Status: DC | PRN
  Administered 2012-12-24: 0.5 mg via INTRAVENOUS
  Filled 2012-12-22: qty 1

## 2012-12-22 MED ORDER — METHOCARBAMOL 100 MG/ML IJ SOLN
500.0000 mg | Freq: Four times a day (QID) | INTRAVENOUS | Status: DC | PRN
  Administered 2012-12-22: 500 mg via INTRAVENOUS
  Filled 2012-12-22: qty 5

## 2012-12-22 MED ORDER — ONDANSETRON HCL 4 MG PO TABS: 4.0000 mg | ORAL_TABLET | Freq: Four times a day (QID) | ORAL | Status: DC | PRN

## 2012-12-22 MED ORDER — ACETAMINOPHEN 325 MG PO TABS
650.0000 mg | ORAL_TABLET | Freq: Four times a day (QID) | ORAL | Status: DC | PRN
  Administered 2012-12-24 – 2012-12-25 (×2): 650 mg via ORAL
  Filled 2012-12-22 (×2): qty 2

## 2012-12-22 MED ORDER — ACETAMINOPHEN 10 MG/ML IV SOLN
INTRAVENOUS | Status: AC
  Administered 2012-12-22: 1000 mg via INTRAVENOUS
  Filled 2012-12-22: qty 100

## 2012-12-22 MED ORDER — OXYCODONE HCL 5 MG/5ML PO SOLN: 5.0000 mg | Freq: Once | ORAL | Status: DC | PRN

## 2012-12-22 MED ORDER — MIDAZOLAM HCL 2 MG/2ML IJ SOLN: 0.5000 mg | Freq: Once | INTRAMUSCULAR | Status: DC | PRN

## 2012-12-22 MED ORDER — TAMSULOSIN HCL 0.4 MG PO CAPS
0.4000 mg | ORAL_CAPSULE | Freq: Every day | ORAL | Status: DC
  Administered 2012-12-23 – 2012-12-25 (×3): 0.4 mg via ORAL
  Filled 2012-12-22 (×3): qty 1

## 2012-12-22 MED ORDER — AMLODIPINE BESY-BENAZEPRIL HCL 5-10 MG PO CAPS: 1.0000 | ORAL_CAPSULE | Freq: Every day | ORAL | Status: DC

## 2012-12-22 MED ORDER — PROPOFOL 10 MG/ML IV BOLUS
INTRAVENOUS | Status: DC | PRN
  Administered 2012-12-22: 130 mg via INTRAVENOUS

## 2012-12-22 MED ORDER — MENTHOL 3 MG MT LOZG: 1.0000 | LOZENGE | OROMUCOSAL | Status: DC | PRN

## 2012-12-22 MED ORDER — OXYCODONE HCL 5 MG PO TABS
5.0000 mg | ORAL_TABLET | ORAL | Status: DC | PRN
  Administered 2012-12-23 – 2012-12-25 (×11): 10 mg via ORAL
  Filled 2012-12-22 (×11): qty 2

## 2012-12-22 MED ORDER — ONDANSETRON HCL 4 MG/2ML IJ SOLN
INTRAMUSCULAR | Status: DC | PRN
  Administered 2012-12-22: 4 mg via INTRAVENOUS

## 2012-12-22 MED ORDER — BUPIVACAINE-EPINEPHRINE PF 0.5-1:200000 % IJ SOLN
INTRAMUSCULAR | Status: DC | PRN
  Administered 2012-12-22: 30 mL

## 2012-12-22 MED ORDER — ACETAMINOPHEN 650 MG RE SUPP: 650.0000 mg | Freq: Four times a day (QID) | RECTAL | Status: DC | PRN

## 2012-12-22 MED ORDER — PROMETHAZINE HCL 25 MG/ML IJ SOLN: 6.2500 mg | INTRAMUSCULAR | Status: DC | PRN

## 2012-12-22 MED ORDER — MIDAZOLAM HCL 2 MG/2ML IJ SOLN
INTRAMUSCULAR | Status: AC
  Filled 2012-12-22: qty 2

## 2012-12-22 MED ORDER — HYDROMORPHONE HCL PF 1 MG/ML IJ SOLN
INTRAMUSCULAR | Status: AC
  Filled 2012-12-22: qty 1

## 2012-12-22 MED ORDER — LACTATED RINGERS IV SOLN
INTRAVENOUS | Status: DC | PRN
  Administered 2012-12-22 (×2): via INTRAVENOUS

## 2012-12-22 MED ORDER — SIMVASTATIN 5 MG PO TABS
5.0000 mg | ORAL_TABLET | Freq: Every day | ORAL | Status: DC
  Administered 2012-12-22 – 2012-12-24 (×3): 5 mg via ORAL
  Filled 2012-12-22 (×5): qty 1

## 2012-12-22 MED ORDER — BISACODYL 5 MG PO TBEC
5.0000 mg | DELAYED_RELEASE_TABLET | Freq: Every day | ORAL | Status: DC | PRN
  Administered 2012-12-23 – 2012-12-24 (×2): 5 mg via ORAL
  Filled 2012-12-22 (×2): qty 1

## 2012-12-22 MED ORDER — ARTIFICIAL TEARS OP OINT
TOPICAL_OINTMENT | OPHTHALMIC | Status: DC | PRN
  Administered 2012-12-22: 1 via OPHTHALMIC

## 2012-12-22 MED ORDER — AMLODIPINE BESYLATE 5 MG PO TABS
5.0000 mg | ORAL_TABLET | Freq: Every day | ORAL | Status: DC
  Administered 2012-12-23 – 2012-12-25 (×3): 5 mg via ORAL
  Filled 2012-12-22 (×3): qty 1

## 2012-12-22 MED ORDER — LACTATED RINGERS IV SOLN: INTRAVENOUS | Status: DC

## 2012-12-22 MED ORDER — HYDROMORPHONE HCL PF 1 MG/ML IJ SOLN
0.2500 mg | INTRAMUSCULAR | Status: DC | PRN
  Administered 2012-12-22 (×4): 0.5 mg via INTRAVENOUS

## 2012-12-22 MED ORDER — DOCUSATE SODIUM 100 MG PO CAPS
100.0000 mg | ORAL_CAPSULE | Freq: Two times a day (BID) | ORAL | Status: DC
  Administered 2012-12-22 – 2012-12-25 (×6): 100 mg via ORAL
  Filled 2012-12-22 (×6): qty 1

## 2012-12-22 MED ORDER — METOCLOPRAMIDE HCL 10 MG PO TABS: 5.0000 mg | ORAL_TABLET | Freq: Three times a day (TID) | ORAL | Status: DC | PRN

## 2012-12-22 MED ORDER — METOCLOPRAMIDE HCL 5 MG/ML IJ SOLN: 5.0000 mg | Freq: Three times a day (TID) | INTRAMUSCULAR | Status: DC | PRN

## 2012-12-22 MED ORDER — FINASTERIDE 5 MG PO TABS
5.0000 mg | ORAL_TABLET | Freq: Every day | ORAL | Status: DC
  Administered 2012-12-23 – 2012-12-25 (×3): 5 mg via ORAL
  Filled 2012-12-22 (×3): qty 1

## 2012-12-22 SURGICAL SUPPLY — 60 items
BANDAGE ELASTIC 4 VELCRO ST LF (GAUZE/BANDAGES/DRESSINGS) ×2 IMPLANT
BANDAGE ESMARK 6X9 LF (GAUZE/BANDAGES/DRESSINGS) ×1 IMPLANT
BANDAGE GAUZE ELAST BULKY 4 IN (GAUZE/BANDAGES/DRESSINGS) ×3 IMPLANT
BLADE SAGITTAL 25.0X1.19X90 (BLADE) ×2 IMPLANT
BLADE SURG ROTATE 9660 (MISCELLANEOUS) IMPLANT
BNDG CMPR 9X6 STRL LF SNTH (GAUZE/BANDAGES/DRESSINGS) ×1
BNDG CMPR MED 10X6 ELC LF (GAUZE/BANDAGES/DRESSINGS) ×1
BNDG ELASTIC 6X10 VLCR STRL LF (GAUZE/BANDAGES/DRESSINGS) ×2 IMPLANT
BNDG ESMARK 6X9 LF (GAUZE/BANDAGES/DRESSINGS) ×2
BOWL SMART MIX CTS (DISPOSABLE) ×2 IMPLANT
CEMENT HV SMART SET (Cement) ×4 IMPLANT
CLOTH BEACON ORANGE TIMEOUT ST (SAFETY) ×2 IMPLANT
COVER SURGICAL LIGHT HANDLE (MISCELLANEOUS) ×2 IMPLANT
CUFF TOURNIQUET SINGLE 34IN LL (TOURNIQUET CUFF) ×2 IMPLANT
CUFF TOURNIQUET SINGLE 44IN (TOURNIQUET CUFF) IMPLANT
DRAPE EXTREMITY T 121X128X90 (DRAPE) ×2 IMPLANT
DRAPE PROXIMA HALF (DRAPES) ×2 IMPLANT
DRAPE U-SHAPE 47X51 STRL (DRAPES) ×2 IMPLANT
DRSG ADAPTIC 3X8 NADH LF (GAUZE/BANDAGES/DRESSINGS) ×1 IMPLANT
DRSG PAD ABDOMINAL 8X10 ST (GAUZE/BANDAGES/DRESSINGS) ×2 IMPLANT
DURAPREP 26ML APPLICATOR (WOUND CARE) ×2 IMPLANT
ELECT REM PT RETURN 9FT ADLT (ELECTROSURGICAL) ×2
ELECTRODE REM PT RTRN 9FT ADLT (ELECTROSURGICAL) ×1 IMPLANT
FACESHIELD LNG OPTICON STERILE (SAFETY) ×4 IMPLANT
GLOVE BIO SURGEON STRL SZ8.5 (GLOVE) ×2 IMPLANT
GLOVE BIOGEL PI IND STRL 8 (GLOVE) ×1 IMPLANT
GLOVE BIOGEL PI IND STRL 8.5 (GLOVE) ×1 IMPLANT
GLOVE BIOGEL PI INDICATOR 8 (GLOVE) ×1
GLOVE BIOGEL PI INDICATOR 8.5 (GLOVE) ×1
GLOVE SS BIOGEL STRL SZ 8 (GLOVE) ×1 IMPLANT
GLOVE SUPERSENSE BIOGEL SZ 8 (GLOVE) ×1
GOWN PREVENTION PLUS XLARGE (GOWN DISPOSABLE) ×2 IMPLANT
GOWN PREVENTION PLUS XXLARGE (GOWN DISPOSABLE) ×2 IMPLANT
GOWN STRL NON-REIN LRG LVL3 (GOWN DISPOSABLE) ×2 IMPLANT
HANDPIECE INTERPULSE COAX TIP (DISPOSABLE) ×2
HOOD PEEL AWAY FACE SHEILD DIS (HOOD) ×2 IMPLANT
IMMOBILIZER KNEE 20 (SOFTGOODS)
IMMOBILIZER KNEE 20 THIGH 36 (SOFTGOODS) IMPLANT
IMMOBILIZER KNEE 22 UNIV (SOFTGOODS) ×2 IMPLANT
IMMOBILIZER KNEE 24 THIGH 36 (MISCELLANEOUS) IMPLANT
IMMOBILIZER KNEE 24 UNIV (MISCELLANEOUS)
KIT BASIN OR (CUSTOM PROCEDURE TRAY) ×2 IMPLANT
KIT ROOM TURNOVER OR (KITS) ×2 IMPLANT
MANIFOLD NEPTUNE II (INSTRUMENTS) ×2 IMPLANT
NS IRRIG 1000ML POUR BTL (IV SOLUTION) ×2 IMPLANT
PACK TOTAL JOINT (CUSTOM PROCEDURE TRAY) ×2 IMPLANT
PAD ARMBOARD 7.5X6 YLW CONV (MISCELLANEOUS) ×4 IMPLANT
SET HNDPC FAN SPRY TIP SCT (DISPOSABLE) ×1 IMPLANT
SPONGE GAUZE 4X4 12PLY (GAUZE/BANDAGES/DRESSINGS) ×2 IMPLANT
STAPLER VISISTAT 35W (STAPLE) ×2 IMPLANT
SUCTION FRAZIER TIP 10 FR DISP (SUCTIONS) IMPLANT
SUT VIC AB 0 CT1 27 (SUTURE) ×2
SUT VIC AB 0 CT1 27XBRD ANBCTR (SUTURE) ×2 IMPLANT
SUT VIC AB 2-0 CT1 27 (SUTURE) ×4
SUT VIC AB 2-0 CT1 TAPERPNT 27 (SUTURE) ×2 IMPLANT
SUT VLOC 180 0 24IN GS25 (SUTURE) ×2 IMPLANT
TOWEL OR 17X24 6PK STRL BLUE (TOWEL DISPOSABLE) ×2 IMPLANT
TOWEL OR 17X26 10 PK STRL BLUE (TOWEL DISPOSABLE) ×2 IMPLANT
TRAY FOLEY CATH 14FR (SET/KITS/TRAYS/PACK) ×2 IMPLANT
WATER STERILE IRR 1000ML POUR (IV SOLUTION) ×5 IMPLANT

## 2012-12-22 NOTE — Evaluation (Signed)
Physical Therapy Evaluation Patient Details Name: Kenneth Fleming MRN: [id number] DOB: [birth year] Today's Date: 12/22/2012 Time: 4098-1191 PT Time Calculation (min): 30 min  PT Assessment / Plan / Recommendation Clinical Impression  Pt is an 79 y/o male s/p L TKA.  Pt doing well with mobility and should progress quickly. Pt is a good candiate to d/c home on POD 1.  Acute PT to follow pt to progress mobiliyt    PT Assessment  Patient needs continued PT services    Follow Up Recommendations  Home health PT;Supervision - Intermittent    Does the patient have the potential to tolerate intense rehabilitation      Barriers to Discharge None      Equipment Recommendations  None recommended by PT    Recommendations for Other Services     Frequency 7X/week    Precautions / Restrictions Precautions Precautions: Knee Required Braces or Orthoses: Knee Immobilizer - Right Restrictions Weight Bearing Restrictions: Yes LLE Weight Bearing: Weight bearing as tolerated   Pertinent Vitals/Pain Pt reporting 2-3/10 pain in knee. No intervention required.        Mobility  Bed Mobility Bed Mobility: Supine to Sit Supine to Sit: 4: Min guard Details for Bed Mobility Assistance: Pt able to manage L LE independently.   Transfers Transfers: Sit to Stand;Stand to Sit Sit to Stand: 4: Min guard;From bed;With upper extremity assist;From chair/3-in-1 Stand to Sit: 4: Min guard;To chair/3-in-1;With upper extremity assist Details for Transfer Assistance: Verbal cues for technique.   Ambulation/Gait Ambulation/Gait Assistance: 4: Min guard Ambulation Distance (Feet): 20 Feet Assistive device: Rolling walker Ambulation/Gait Assistance Details: Cues for gait sequencing.   Gait Pattern: Step-to pattern Stairs: No Wheelchair Mobility Wheelchair Mobility: No    Shoulder Instructions     Exercises Total Joint Exercises Ankle Circles/Pumps: 10 reps;Both   PT Diagnosis: Abnormality of  gait;Generalized weakness;Acute pain  PT Problem List: Decreased strength;Decreased mobility;Decreased range of motion;Decreased knowledge of use of DME;Pain PT Treatment Interventions: Gait training;DME instruction;Stair training;Functional mobility training;Therapeutic activities;Therapeutic exercise;Patient/family education   PT Goals Acute Rehab PT Goals PT Goal Formulation: With patient/family Time For Goal Achievement: 12/29/12 Potential to Achieve Goals: Good Pt will go Supine/Side to Sit: with modified independence;with HOB 0 degrees PT Goal: Supine/Side to Sit - Progress: Goal set today Pt will go Sit to Supine/Side: with modified independence;with HOB 0 degrees PT Goal: Sit to Supine/Side - Progress: Goal set today Pt will go Sit to Stand: with modified independence PT Goal: Sit to Stand - Progress: Goal set today Pt will go Stand to Sit: with modified independence;with upper extremity assist PT Goal: Stand to Sit - Progress: Goal set today Pt will Transfer Bed to Chair/Chair to Bed: with modified independence PT Transfer Goal: Bed to Chair/Chair to Bed - Progress: Goal set today Pt will Ambulate: >150 feet;with modified independence;with rolling walker PT Goal: Ambulate - Progress: Goal set today Pt will Go Up / Down Stairs: 3-5 stairs;with modified independence;with least restrictive assistive device PT Goal: Up/Down Stairs - Progress: Goal set today Pt will Perform Home Exercise Program: Independently PT Goal: Perform Home Exercise Program - Progress: Goal set today  Visit Information  Last PT Received On: 12/22/12    Subjective Data  Subjective: agree to Evall Patient Stated Goal: Walk without pain.   Prior Functioning  Home Living Lives With: Spouse Available Help at Discharge: Family;Available 24 hours/day Type of Home: House Home Access: Stairs to enter [employer] of Steps: 3 Entrance Stairs-Rails: Right;Left;Can reach both  Home Layout: Two  level;Bed/bath upstairs Home Adaptive Equipment: Walker - rolling;Bedside commode/3-in-1 Prior Function Level of Independence: Independent Able to Take Stairs?: Yes Driving: Yes Vocation: Retired    IT consultant  Overall Cognitive Status: Appears within functional limits for tasks assessed/performed Arousal/Alertness: Lethargic Orientation Level: Appears intact for tasks assessed Behavior During Session: [facility] for tasks performed    Extremity/Trunk Assessment Right Upper Extremity Assessment RUE ROM/Strength/Tone: Within functional levels Left Upper Extremity Assessment LUE ROM/Strength/Tone: Within functional levels Right Lower Extremity Assessment RLE ROM/Strength/Tone: Within functional levels Left Lower Extremity Assessment LLE ROM/Strength/Tone: Within functional levels;Unable to fully assess Trunk Assessment Trunk Assessment: Normal   Balance    End of Session PT - End of Session Equipment Utilized During Treatment: Gait belt;Left knee immobilizer Activity Tolerance: Patient tolerated treatment well Patient left: in chair;with call bell/phone within reach;with family/visitor present Nurse Communication: Mobility status CPM Left Knee CPM Left Knee: Off Left Knee Flexion (Degrees): 60  Left Knee Extension (Degrees): 0   GP     Kiyah Demartini 12/22/2012, 8:06 PM Nillie Bartolotta L. Danyle Boening DPT [phone]

## 2012-12-22 NOTE — Progress Notes (Signed)
UR COMPLETED  

## 2012-12-22 NOTE — Addendum Note (Signed)
Addendum  created 12/22/12 1352 by Germaine Pomfret, MD   Modules edited:Orders

## 2012-12-22 NOTE — Transfer of Care (Signed)
Immediate Anesthesia Transfer of Care Note  Patient: Kenneth Fleming  Procedure(s) Performed: Procedure(s) (LRB) with comments: TOTAL KNEE ARTHROPLASTY (Left)  Patient Location: PACU  Anesthesia Type:General  Level of Consciousness: awake, alert , oriented and patient cooperative  Airway & Oxygen Therapy: Patient Spontanous Breathing and Patient connected to nasal cannula oxygen  Post-op Assessment: Report given to PACU RN, Post -op Vital signs reviewed and stable and Patient moving all extremities  Post vital signs: Reviewed and stable  Complications: No apparent anesthesia complications

## 2012-12-22 NOTE — Progress Notes (Signed)
Pt stateas feels much better after additional dilaudid

## 2012-12-22 NOTE — Preoperative (Signed)
Beta Blockers   Reason not to administer Beta Blockers: not prescribed 

## 2012-12-22 NOTE — Progress Notes (Signed)
PATIENT WAS GOTTEN READY BY  Shela Commons Faron Whitelock RN .

## 2012-12-22 NOTE — Anesthesia Postprocedure Evaluation (Signed)
  Anesthesia Post-op Note  Patient: Kenneth Fleming  Procedure(s) Performed: Procedure(s) (LRB) with comments: TOTAL KNEE ARTHROPLASTY (Left)  Patient Location: PACU  Anesthesia Type:GA combined with regional for post-op pain  Level of Consciousness: awake, alert , oriented and patient cooperative  Airway and Oxygen Therapy: Patient Spontanous Breathing and Patient connected to nasal cannula oxygen  Post-op Pain: mild  Post-op Assessment: Post-op Vital signs reviewed, Patient's Cardiovascular Status Stable, Respiratory Function Stable, Patent Airway, No signs of Nausea or vomiting and Pain level controlled  Post-op Vital Signs: Reviewed and stable  Complications: No apparent anesthesia complications

## 2012-12-22 NOTE — Op Note (Signed)
PREOP DIAGNOSIS: DJD LEFT KNEE POSTOP DIAGNOSIS: DJD LEFT KNEE PROCEDURE: LEFT TKR ANESTHESIA: General and block ATTENDING SURGEON: Covey Baller G ASSISTANT: Lindwood Qua PA  INDICATIONS FOR PROCEDURE: MAKAVELI HOARD is a 79 y.o. male who has struggled for a long time with pain due to degenerative arthritis of the left knee.  The patient has failed many conservative non-operative measures and at this point has pain which limits the ability to sleep and walk.  The patient is offered total knee replacement.  Informed operative consent was obtained after discussion of possible risks of anesthesia, infection, neurovascular injury, DVT, and death.  The importance of the post-operative rehabilitation protocol to optimize result was stressed extensively with the patient.  SUMMARY OF FINDINGS AND PROCEDURE:  AEDYN KEMPFER was taken to the operative suite where under the above anesthesia a left knee replacement was performed.  There were advanced degenerative changes and the bone quality was excellent.  We used the DePuy system and placed size large femur, 6 tibia, 41 mm all polyethylene patella, and a size 10 mm spacer.  The patient was admitted for appropriate post-op care to include perioperative antibiotics and mechanical and pharmacologic measures for DVT prophylaxis.  DESCRIPTION OF PROCEDURE:  RAYMONT ANDREONI was taken to the operative suite where the above anesthesia was applied.  The patient was positioned supine and prepped and draped in normal sterile fashion.  An appropriate time out was performed.  After the administration of Kefzol pre-op antibiotic the leg was elevated and exsanguinated and a tourniquet inflated.  A standard longitudinal incision was made on the anterior knee.  Dissection was carried down to the extensor mechanism.  All appropriate anti-infective measures were used including the pre-operative antibiotic, betadine impregnated drape, and closed hooded exhaust systems for each member  of the surgical team.  A medial parapatellar incision was made in the extensor mechanism and the knee cap flipped and the knee flexed.  Some residual meniscal tissues were removed along with any remaining ACL/PCL tissue.  A guide was placed on the tibia and a flat cut was made on it's superior surface.  An intramedullary guide was placed in the femur and was utilized to make anterior and posterior cuts creating an appropriate flexion gap.  A second intramedullary guide was placed in the femur to make a distal cut properly balancing the knee with an extension gap equal to the flexion gap.  The three bones sized to the above mentioned sizes and the appropriate guides were placed and utilized.  A trial reduction was done and the knee easily came to full extension and the patella tracked well on flexion.  The trial components were removed and all bones were cleaned with pulsatile lavage and then dried thoroughly.  Cement was mixed and was pressurized onto the bones followed by placement of the aforementioned components.  Excess cement was trimmed and pressure was held on the components until the cement had hardened.  The tourniquet was deflated and a small amount of bleeding was controlled with cautery and pressure.  The knee was irrigated thoroughly.  The extensor mechanism was re-approximated with V-loc suture in running fashion.  The knee was flexed and the repair was solid.  The subcutaneous tissues were re-approximated with #0 and #2-0 vicryl and the skin closed with a subcuticular stitch and steristrips.  A sterile dressing was applied.  Intraoperative fluids, EBL, and tourniquet time can be obtained from anesthesia records.  DISPOSITION:  The patient was taken to recovery room in  stable condition and admitted for appropriate post-op care to include peri-operative antibiotic and DVT prophylaxis with mechanical and pharmacologic measures.  Noma Quijas G 12/22/2012, 11:49 AM

## 2012-12-22 NOTE — Anesthesia Preprocedure Evaluation (Addendum)
Anesthesia Evaluation  Patient identified by MRN, date of birth, ID band Patient awake    Reviewed: Allergy & Precautions, H&P , NPO status , Patient's Chart, lab work & pertinent test results  History of Anesthesia Complications Negative for: history of anesthetic complications  Airway Mallampati: I TM Distance: >3 FB Neck ROM: Full    Dental No notable dental hx. (+) Teeth Intact, Dental Advisory Given and Partial Upper   Pulmonary former smoker (quit 40 years),  breath sounds clear to auscultation  Pulmonary exam normal       Cardiovascular hypertension, Pt. on medications Rhythm:Regular Rate:Normal  Echo on 06/05/12 showed mild concentric LVH with moderate septal hypertrophy, hyperdynamic LV contraction, no regional wall motion abnormalities, LVEF 70-75%. Mild MR/TR.  Mildly elevated estimated RV systolic pressure.  Mild aortic root dilatation.   Neuro/Psych negative neurological ROS     GI/Hepatic negative GI ROS, Neg liver ROS,   Endo/Other  negative endocrine ROS  Renal/GU negative Renal ROS     Musculoskeletal   Abdominal   Peds  Hematology   Anesthesia Other Findings   Reproductive/Obstetrics                         Anesthesia Physical Anesthesia Plan  ASA: II  Anesthesia Plan: General   Post-op Pain Management:    Induction: Intravenous  Airway Management Planned: LMA  Additional Equipment:   Intra-op Plan:   Post-operative Plan: Extubation in OR  Informed Consent: I have reviewed the patients History and Physical, chart, labs and discussed the procedure including the risks, benefits and alternatives for the proposed anesthesia with the patient or authorized representative who has indicated his/her understanding and acceptance.   Dental advisory given  Plan Discussed with: CRNA and Surgeon  Anesthesia Plan Comments: (Plan routine monitors, GA- LMA OK, femoral nerve block  for post op analgesia)        Anesthesia Quick Evaluation

## 2012-12-22 NOTE — Interval H&P Note (Signed)
History and Physical Interval Note:  12/22/2012 9:08 AM  Kenneth Fleming  has presented today for surgery, with the diagnosis of LEFT KNEE DEGENERATIVE JOINT DISEASE  The various methods of treatment have been discussed with the patient and family. After consideration of risks, benefits and other options for treatment, the patient has consented to  Procedure(s) (LRB) with comments: TOTAL KNEE ARTHROPLASTY (Left) as a surgical intervention .  The patient's history has been reviewed, patient examined, no change in status, stable for surgery.  I have reviewed the patient's chart and labs.  Questions were answered to the patient's satisfaction.     Shareen Capwell G

## 2012-12-22 NOTE — Anesthesia Procedure Notes (Addendum)
Anesthesia Regional Block:  Femoral nerve block  Pre-Anesthetic Checklist: ,, timeout performed, Correct Patient, Correct Site, Correct Laterality, Correct Procedure, Correct Position, site marked, Risks and benefits discussed,  Surgical consent,  Pre-op evaluation,  At surgeon's request and post-op pain management  Laterality: Left  Prep: chloraprep       Needles:  Injection technique: Single-shot  Needle Type: Stimulator Needle - 40     Needle Length: 4cm  Needle Gauge: 22 and 22 G    Additional Needles:  Procedures: nerve stimulator Femoral nerve block  Nerve Stimulator or Paresthesia:  Response: patellar twitch, 0.45 mA, 0.1 ms,   Additional Responses:   Narrative:  Start time: 12/22/2012 9:51 AM End time: 12/22/2012 9:57 AM Injection made incrementally with aspirations every 5 mL.  Performed by: Personally  Anesthesiologist: Sandford Craze, MD  Additional Notes: Pt identified in Holding room.  Monitors applied. Working IV access confirmed. Sterile prep L groin.  #22ga PNS to patella twitch at 0.52mA threshold.  30cc 0.5% Bupivacaine with 1:200k epi injected incrementally after negative test dose.  Patient asymptomatic, VSS, no heme aspirated, tolerated well.   Sandford Craze, MD   Procedure Name: LMA Insertion Date/Time: 12/22/2012 10:16 AM Performed by: Lovie Chol Pre-anesthesia Checklist: Patient identified, Emergency Drugs available, Suction available, Patient being monitored and Timeout performed Patient Re-evaluated:Patient Re-evaluated prior to inductionOxygen Delivery Method: Circle system utilized Preoxygenation: Pre-oxygenation with 100% oxygen Intubation Type: IV induction Ventilation: Mask ventilation without difficulty LMA: LMA inserted LMA Size: 4.0 Number of attempts: 1 Airway Equipment and Method: Stylet Placement Confirmation: positive ETCO2,  CO2 detector and breath sounds checked- equal and bilateral Tube secured with: Tape Dental Injury: Teeth  and Oropharynx as per pre-operative assessment

## 2012-12-22 NOTE — Progress Notes (Signed)
Orthopedic Tech Progress Note Patient Details:  Kenneth Fleming 1926-06-29 [id number] CPM applied to Left LE with appropriate settings. OHF applied to bed.  CPM Left Knee CPM Left Knee: On Left Knee Flexion (Degrees): 60  Left Knee Extension (Degrees): 0  CPM Right Knee CPM Right Knee: On Right Knee Flexion (Degrees):  (0-60)   [place] R Thompson 12/22/2012, 1:45 PM

## 2012-12-22 NOTE — Progress Notes (Signed)
crna states dr Peggye Fothergill aware of pts bloodu urine.will contiue to  Monitor/observe

## 2012-12-23 LAB — CBC
HCT: 33.6 % — ABNORMAL LOW (ref 39.0–52.0)
MCH: 25.1 pg — ABNORMAL LOW (ref 26.0–34.0)
MCV: 80.2 fL (ref 78.0–100.0)
Platelets: 281 10*3/uL (ref 150–400)
RDW: 17.1 % — ABNORMAL HIGH (ref 11.5–15.5)

## 2012-12-23 LAB — BASIC METABOLIC PANEL
CO2: 27 mEq/L (ref 19–32)
GFR calc non Af Amer: 62 mL/min — ABNORMAL LOW (ref >90)
Glucose, Bld: 130 mg/dL — ABNORMAL HIGH (ref 70–99)

## 2012-12-23 MED ORDER — ACETAMINOPHEN 10 MG/ML IV SOLN: 1000.0000 mg | Freq: Once | INTRAVENOUS | Status: AC

## 2012-12-23 NOTE — Progress Notes (Signed)
Physical Therapy Treatment Patient Details Name: Kenneth Fleming MRN: [id number] DOB: Nov 30, 1926 Today's Date: 12/23/2012 Time: 0750-0830 PT Time Calculation (min): 40 min  PT Assessment / Plan / Recommendation Comments on Treatment Session  Patient is progessing with increased ambulation this session. Some decrease in pain post treatment. Will attempt steps next session.    Follow Up Recommendations  Home health PT     Does the patient have the potential to tolerate intense rehabilitation     Barriers to Discharge        Equipment Recommendations       Recommendations for Other Services    Frequency 7X/week   Plan Discharge plan remains appropriate;Frequency remains appropriate    Precautions / Restrictions Precautions Precautions: Knee Required Braces or Orthoses: Knee Immobilizer - Left Knee Immobilizer - Left: On when out of bed or walking Restrictions LLE Weight Bearing: Weight bearing as tolerated   Pertinent Vitals/Pain 8/10 pain.  Repositioned    Mobility  Bed Mobility Supine to Sit: 4: Min assist Details for Bed Mobility Assistance: A to lower LLE to floor due to pain and stiffness. Transfers Sit to Stand: 4: Min guard;With upper extremity assist;From bed Stand to Sit: 4: Min guard;With upper extremity assist;To chair/3-in-1 Ambulation/Gait Ambulation/Gait Assistance: 4: Min guard Ambulation Distance (Feet): 30 Feet Assistive device: Rolling walker Ambulation/Gait Assistance Details: Cueing for posture and relaxing the shoulders.  Gait Pattern: Step-to pattern    Exercises Total Joint Exercises Ankle Circles/Pumps: AROM;Both;15 reps Quad Sets: AROM;Both;10 reps Short Arc QuadBarbaraann Boys;Left;10 reps Heel Slides: 10 reps;Left;AAROM Hip ABduction/ADduction: AAROM;Left;10 reps Straight Leg Raises: AAROM;Left;10 reps   PT Diagnosis:    PT Problem List:   PT Treatment Interventions:     PT Goals Acute Rehab PT Goals PT Goal: Supine/Side to Sit - Progress:  Progressing toward goal PT Goal: Sit to Stand - Progress: Progressing toward goal PT Goal: Stand to Sit - Progress: Progressing toward goal PT Goal: Ambulate - Progress: Progressing toward goal PT Goal: Perform Home Exercise Program - Progress: Progressing toward goal  Visit Information  Last PT Received On: 12/23/12 Assistance Needed: +1    Subjective Data      Cognition  Overall Cognitive Status: Appears within functional limits for tasks assessed/performed Arousal/Alertness: Awake/alert Orientation Level: Appears intact for tasks assessed Behavior During Session: [facility] for tasks performed    Balance     End of Session PT - End of Session Equipment Utilized During Treatment: Gait belt;Left knee immobilizer Activity Tolerance: Patient tolerated treatment well Patient left: in chair;with call bell/phone within reach   GP     Lazaro Arms 12/23/2012, 8:58 AM

## 2012-12-23 NOTE — Progress Notes (Signed)
Subjective: 1 Day Post-Op Procedure(s) (LRB): TOTAL KNEE ARTHROPLASTY (Left)  Activity level:  Can be oob wbat Diet tolerance:  ok Voiding:  ok Patient reports pain as 3 on 0-10 scale.    Objective: Vital signs in last 24 hours: Temp:  [97.5 F (36.4 C)-98.9 F (37.2 C)] 98.9 F (37.2 C) (01/15 0732) Pulse Rate:  [50-78] 55  (01/15 0732) Resp:  [16-20] 20  (01/15 0732) BP: (124-167)/(61-85) 125/85 mmHg (01/15 0732) SpO2:  [94 %-98 %] 98 % (01/15 0732)  Labs: No results found for this basename: HGB:5 in the last 72 hours No results found for this basename: WBC:2,RBC:2,HCT:2,PLT:2 in the last 72 hours No results found for this basename: NA:2,K:2,CL:2,CO2:2,BUN:2,CREATININE:2,GLUCOSE:2,CALCIUM:2 in the last 72 hours No results found for this basename: LABPT:2,INR:2 in the last 72 hours  Physical Exam:  Neurologically intact ABD soft Neurovascular intact Sensation intact distally Intact pulses distally Dorsiflexion/Plantar flexion intact Incision: no drainage No cellulitis present Compartment soft  Assessment/Plan:  1 Day Post-Op Procedure(s) (LRB): TOTAL KNEE ARTHROPLASTY (Left) Advance diet Up with therapy D/C IV fluids Discharge home with home health in 1-2 days Labs pending D/c iv    Kenneth Fleming R 12/23/2012, 8:22 AM

## 2012-12-23 NOTE — Progress Notes (Signed)
Occupational Therapy Evaluation Patient Details Name: Kenneth Fleming MRN: [id number] DOB: [birth year] Today's Date: 12/23/2012 Time: 8295-6213 OT Time Calculation (min): 26 min  OT Assessment / Plan / Recommendation Clinical Impression  79 yo pleasant pt s/p L TKA. Pt will benefit from skilled OT services to max independence with ADL and functinoal mobility for ADL to facilitate D/C home with elderly wife. Pt needs to be mod I with toilet transfers prior to D/C.    OT Assessment  Patient needs continued OT Services    Follow Up Recommendations  Home health OT    Barriers to Discharge None    Equipment Recommendations  None recommended by OT    Recommendations for Other Services  none  Frequency  Min 2X/week    Precautions / Restrictions Precautions Precautions: Knee Required Braces or Orthoses: Knee Immobilizer - Left Knee Immobilizer - Left: On when out of bed or walking Restrictions LLE Weight Bearing: Weight bearing as tolerated   Pertinent Vitals/Pain 7. nsg notified    ADL  Grooming: Set up Upper Body Bathing: Set up Where Assessed - Upper Body Bathing: Unsupported sitting Lower Body Bathing: Moderate assistance Where Assessed - Lower Body Bathing: Supported sit to stand Upper Body Dressing: Set up Where Assessed - Upper Body Dressing: Unsupported sitting Lower Body Dressing: Moderate assistance Where Assessed - Lower Body Dressing: Supported sit to Pharmacist, hospital: Moderate assistance Toilet Transfer Method: Sit to Barista: Bedside commode Toileting - Clothing Manipulation and Hygiene: Minimal assistance Where Assessed - Engineer, mining and Hygiene: Standing Tub/Shower Transfer: Maximal assistance Tub/Shower Transfer Method: Stand pivot Equipment Used: Gait belt;Rolling walker Transfers/Ambulation Related to ADLs: Mod A this pm. Pt fatigued ADL Comments: will benefit from education regarding AE and shower transfer      OT Diagnosis: Generalized weakness;Acute pain  OT Problem List: Decreased strength;Decreased range of motion;Decreased activity tolerance;Decreased safety awareness;Decreased knowledge of use of DME or AE;Pain OT Treatment Interventions: Self-care/ADL training;DME and/or AE instruction;Therapeutic activities;Patient/family education   OT Goals Acute Rehab OT Goals OT Goal Formulation: With patient Time For Goal Achievement: 01/06/13 Potential to Achieve Goals: Good ADL Goals Pt Will Perform Lower Body Bathing: with set-up;Sit to stand from chair;Unsupported;with adaptive equipment;with cueing (comment type and amount) ADL Goal: Lower Body Bathing - Progress: Goal set today Pt Will Perform Lower Body Dressing: with set-up;Sit to stand from chair;Supported;with adaptive equipment;with cueing (comment type and amount) ADL Goal: Lower Body Dressing - Progress: Goal set today Pt Will Transfer to Toilet: with modified independence;Stand pivot transfer;Ambulation;with DME;3-in-1 ADL Goal: Toilet Transfer - Progress: Goal set today Pt Will Perform Toileting - Clothing Manipulation: with modified independence;Standing ADL Goal: Toileting - Clothing Manipulation - Progress: Goal set today  Visit Information  Last OT Received On: 12/23/12    Subjective Data   I need to be able to do this for myself   Prior Functioning     Home Living Lives With: Spouse Available Help at Discharge: Family;Available 24 hours/day Type of Home: House Home Access: Stairs to enter [employer] of Steps: 3 Entrance Stairs-Rails: Right;Left;Can reach both Home Layout: Two level;Able to live on main level with bedroom/bathroom Bathroom Shower/Tub: Walk-in shower;Door [employer] Toilet: Standard Bathroom Accessibility: Yes How Accessible: Accessible via walker Home Adaptive Equipment: Built-in shower seat;Bedside commode/3-in-1;Walker - rolling Prior Function Level of Independence:  Independent Able to Take Stairs?: Yes Driving: Yes Vocation: Retired Comments: wife able to help Communication Communication: No difficulties Dominant Hand: Right  Vision/Perception  WFL   Cognition  Overall Cognitive Status: Appears within functional limits for tasks assessed/performed Arousal/Alertness: Awake/alert Orientation Level: Appears intact for tasks assessed Behavior During Session: [facility] for tasks performed    Extremity/Trunk Assessment Right Upper Extremity Assessment RUE ROM/Strength/Tone: [facility] for tasks assessed Left Upper Extremity Assessment LUE ROM/Strength/Tone: WFL for tasks assessed Right Lower Extremity Assessment RLE ROM/Strength/Tone: [facility] for tasks assessed Left Lower Extremity Assessment LLE ROM/Strength/Tone: Deficits;Due to pain;Due to precautions Trunk Assessment Trunk Assessment: Normal     Mobility Bed Mobility Bed Mobility: Supine to Sit;Sit to Supine Supine to Sit: 5: Supervision;With rails;HOB flat Sit to Supine: 3: Mod assist;HOB flat;With rail Details for Bed Mobility Assistance: A to lift legs onto bed Transfers Transfers: Sit to Stand;Stand to Sit Sit to Stand: 3: Mod assist;With upper extremity assist;From bed Stand to Sit: 4: Min assist;With upper extremity assist;To bed Details for Transfer Assistance: vc for hand placement     Shoulder Instructions     Exercise     Balance  S   End of Session OT - End of Session Equipment Utilized During Treatment: Gait belt Activity Tolerance: Patient tolerated treatment well Patient left: in bed;with call bell/phone within reach Nurse Communication: Mobility status  GO     Clydia Nieves,HILLARY 12/23/2012, 7:26 PM [facility], OTR/L  [phone] 12/23/2012

## 2012-12-23 NOTE — Progress Notes (Signed)
Physical Therapy Treatment Patient Details Name: Kenneth Fleming MRN: [id number] DOB: 26-Jul-1926 Today's Date: 12/23/2012 Time: 4098-1191 PT Time Calculation (min): 22 min  PT Assessment / Plan / Recommendation Comments on Treatment Session  Patient refused ambulation this session but agreeable to exercises in bed. Tolerated increased reps with all exercises. Some decrease in pain with exercises. Patient agreeable with plans for stairs and gait training next session.     Follow Up Recommendations  Home health PT     Does the patient have the potential to tolerate intense rehabilitation     Barriers to Discharge        Equipment Recommendations  None recommended by PT    Recommendations for Other Services    Frequency 7X/week   Plan Discharge plan remains appropriate;Frequency remains appropriate    Precautions / Restrictions Precautions Precautions: Knee Required Braces or Orthoses: Knee Immobilizer - Left Knee Immobilizer - Left: On when out of bed or walking Restrictions LLE Weight Bearing: Weight bearing as tolerated   Pertinent Vitals/Pain 8/10 pain. Reposition. Ice pack anterior L knee    Mobility       Exercises Total Joint Exercises Ankle Circles/Pumps: AROM;15 reps;Both Quad Sets: AROM;Left;15 reps Short Arc QuadBarbaraann Boys;Left;15 reps Heel Slides: AAROM;15 reps;Left Hip ABduction/ADduction: AAROM;Left;15 reps Straight Leg Raises: AAROM;15 reps;Left   PT Diagnosis:    PT Problem List:   PT Treatment Interventions:     PT Goals Acute Rehab PT Goals PT Goal: Perform Home Exercise Program - Progress: Progressing toward goal  Visit Information  Last PT Received On: 12/23/12 Assistance Needed: +1    Subjective Data      Cognition  Overall Cognitive Status: Appears within functional limits for tasks assessed/performed Arousal/Alertness: Awake/alert Orientation Level: Appears intact for tasks assessed Behavior During Session: [facility] for tasks performed      Balance     End of Session PT - End of Session Activity Tolerance: Patient tolerated treatment well Patient left: in bed;with call bell/phone within reach   GP     Lazaro Arms 12/23/2012, 2:54 PM

## 2012-12-23 NOTE — Progress Notes (Signed)
12/23/2012 Fredrich Birks PTA [phone] pager [phone] office

## 2012-12-23 NOTE — Plan of Care (Signed)
Problem: Consults Goal: Diagnosis- Total Joint Replacement Primary Total Knee left total knee replacement

## 2012-12-23 NOTE — Progress Notes (Signed)
Seen and agreed 10/13/2013 Kinya Meine Elizabeth PTA [phone] pager [phone] office    

## 2012-12-24 ENCOUNTER — Encounter (HOSPITAL_COMMUNITY): Payer: Self-pay | Admitting: Orthopaedic Surgery

## 2012-12-24 LAB — CBC
HCT: 30.6 % — ABNORMAL LOW (ref 39.0–52.0)
Hemoglobin: 9.7 g/dL — ABNORMAL LOW (ref 13.0–17.0)
MCHC: 31.7 g/dL (ref 30.0–36.0)
WBC: 10.6 10*3/uL — ABNORMAL HIGH (ref 4.0–10.5)

## 2012-12-24 MED ORDER — ASPIRIN 325 MG PO TBEC: 325.0000 mg | DELAYED_RELEASE_TABLET | Freq: Two times a day (BID) | ORAL | Status: DC

## 2012-12-24 MED ORDER — OXYCODONE HCL 5 MG PO TABS: 5.0000 mg | ORAL_TABLET | ORAL | Status: DC | PRN

## 2012-12-24 NOTE — Progress Notes (Signed)
Subjective: 2 Days Post-Op Procedure(s) (LRB): TOTAL KNEE ARTHROPLASTY (Left)  Activity level:  OOB with PT a bit Diet tolerance:  regular Voiding:  well Patient reports pain as moderate.    Objective: Vital signs in last 24 hours: Temp:  [98.3 F (36.8 C)-99.8 F (37.7 C)] 99.6 F (37.6 C) (01/16 1357) Pulse Rate:  [76-90] 90  (01/16 1357) Resp:  [18-20] 18  (01/16 1357) BP: (107-125)/(51-58) 125/58 mmHg (01/16 1357) SpO2:  [96 %-100 %] 100 % (01/16 1357)  Labs:  Basename 12/24/12 0542 12/23/12 1100  HGB 9.7* 10.5*    Basename 12/24/12 0542 12/23/12 1100  WBC 10.6* 10.5  RBC 3.83* 4.19*  HCT 30.6* 33.6*  PLT 245 281    Basename 12/23/12 1100  NA 137  K 4.3  CL 101  CO2 27  BUN 13  CREATININE 1.06  GLUCOSE 130*  CALCIUM 9.0   No results found for this basename: LABPT:2,INR:2 in the last 72 hours  Physical Exam:  ABD soft Neurovascular intact Sensation intact distally Intact pulses distally Dorsiflexion/Plantar flexion intact Incision: no drainage No cellulitis present Compartment soft  Assessment/Plan:  2 Days Post-Op Procedure(s) (LRB): TOTAL KNEE ARTHROPLASTY (Left) Up with therapy Plan for discharge tomorrow    Kenneth Fleming G 12/24/2012, 3:17 PM

## 2012-12-24 NOTE — Progress Notes (Signed)
Physical Therapy Treatment Patient Details Name: Kenneth Fleming MRN: [id number] DOB: 1926-08-20 Today's Date: 12/24/2012 Time: 4098-1191 PT Time Calculation (min): 31 min  PT Assessment / Plan / Recommendation Comments on Treatment Session  Patient agreeable to ambulation this session after just being transfered to bed with nursing. Patient stated " I am getting better."  Slow progress with max encouragement. Will continue plan of care. Patient progressing slowly however wanting to return home and does not agree to SNF at this time. Patient will need to progress with ambulation and stair training prior to DC home.     Follow Up Recommendations  Home health PT     Does the patient have the potential to tolerate intense rehabilitation     Barriers to Discharge        Equipment Recommendations  None recommended by PT    Recommendations for Other Services    Frequency 7X/week   Plan Discharge plan remains appropriate;Frequency remains appropriate    Precautions / Restrictions Precautions Precautions: Knee Required Braces or Orthoses: Knee Immobilizer - Right Knee Immobilizer - Right: On when out of bed or walking Knee Immobilizer - Left: On when out of bed or walking Restrictions Weight Bearing Restrictions: Yes LLE Weight Bearing: Weight bearing as tolerated   Pertinent Vitals/Pain 8/10. Patient repositioned. Some relief with treatment.    Mobility  Bed Mobility Supine to Sit: 4: Min assist Sit to Supine: 4: Min assist Details for Bed Mobility Assistance: A to lower LLE to floor. Transfers Sit to Stand: 3: Mod assist;From bed;With upper extremity assist Stand to Sit: 4: Min assist;To bed Details for Transfer Assistance: A with initiation for sit to stand.  Ambulation/Gait Ambulation/Gait Assistance: 4: Min assist Ambulation Distance (Feet): 30 Feet Assistive device: Rolling walker Ambulation/Gait Assistance Details: Patient to keep the walker too close. Heavy reliance on  walker for WB through UEs. Constant for WBAT and posture. Gait Pattern: Antalgic;Step-to pattern;Decreased step length - right;Decreased step length - left Gait velocity: decreased    Exercises     PT Diagnosis:    PT Problem List:   PT Treatment Interventions:     PT Goals Acute Rehab PT Goals PT Goal: Supine/Side to Sit - Progress: Progressing toward goal PT Goal: Sit to Supine/Side - Progress: Progressing toward goal PT Goal: Sit to Stand - Progress: Progressing toward goal PT Goal: Stand to Sit - Progress: Progressing toward goal PT Goal: Ambulate - Progress: Progressing toward goal  Visit Information  Last PT Received On: 12/24/12 Assistance Needed: +1    Subjective Data      Cognition  Overall Cognitive Status: Appears within functional limits for tasks assessed/performed Arousal/Alertness: Awake/alert Orientation Level: Appears intact for tasks assessed Behavior During Session: [facility] for tasks performed    Balance  Balance Balance Assessed: No  End of Session PT - End of Session Equipment Utilized During Treatment: Gait belt Activity Tolerance: Patient tolerated treatment well Patient left: in bed;with call bell/phone within reach   GP     Lazaro Arms 12/24/2012, 2:46 PM

## 2012-12-24 NOTE — Progress Notes (Signed)
Occupational Therapy Treatment Patient Details Name: Kenneth Fleming MRN: [id number] DOB: 02-25-1926 Today's Date: 12/24/2012 Time: 4098-1191 OT Time Calculation (min): 33 min  OT Assessment / Plan / Recommendation Comments on Treatment Session Pt making slow progress, pt motivated and cooperative and should contiune with OT to maximize leevl of function and safety to return home    Follow Up Recommendations  Home health OT    Barriers to Discharge       Equipment Recommendations       Recommendations for Other Services    Frequency Min 2X/week   Plan Discharge plan remains appropriate;Other (comment) (Pt unsure now of returning home)    Precautions / Restrictions Precautions Precautions: Knee Required Braces or Orthoses: Knee Immobilizer - Right Knee Immobilizer - Right: On when out of bed or walking Knee Immobilizer - Left: On when out of bed or walking Restrictions Weight Bearing Restrictions: Yes LLE Weight Bearing: Weight bearing as tolerated   Pertinent Vitals/Pain 0/10 before activity, 8/10 during activity    ADL  Eating/Feeding: Performed;Set up Where Assessed - Eating/Feeding: Chair Grooming: Performed;Set up Where Assessed - Grooming: Supported sitting Lower Body Bathing: Simulated;Moderate assistance;Minimal assistance Where Assessed - Lower Body Bathing: Unsupported sitting Lower Body Dressing: Performed;Minimal assistance;Moderate assistance;Other (comment) (donning socks, sock aid) Where Assessed - Lower Body Dressing: Unsupported sitting Toilet Transfer: Performed;Moderate assistance Toilet Transfer Method: Sit to stand Toilet Transfer Equipment: Bedside commode;Other (comment) (RW) Equipment Used: Rolling walker;Sock aid;Gait belt;Long-handled shoe horn;Long-handled sponge;Reacher Transfers/Ambulation Related to ADLs: pt fearful and c/o 8/10 pain during sit - stand, used BSC instead of ambulating to restroom ADL Comments: pt provided with ADL A/E education  and demo for home use. Pt concerned that his wife may not be able to provide adequate A for ADLs and home    OT Diagnosis:    OT Problem List:   OT Treatment Interventions:     OT Goals ADL Goals ADL Goal: Lower Body Bathing - Progress: Progressing toward goals ADL Goal: Lower Body Dressing - Progress: Progressing toward goals ADL Goal: Toilet Transfer - Progress: Progressing toward goals  Visit Information  Last OT Received On: 12/24/12 Assistance Needed: +1    Subjective Data  Subjective: " I don't think my wife can help me much at home in this condition, I am starting to rethink going home " Patient Stated Goal: To return home with A of children daily   Prior Functioning       Cognition  Overall Cognitive Status: Appears within functional limits for tasks assessed/performed Arousal/Alertness: Awake/alert Orientation Level: Appears intact for tasks assessed Behavior During Session: [facility] for tasks performed    Mobility  Shoulder Instructions  Transfers Transfers: Sit to Stand;Stand to Sit Sit to Stand: 3: Mod assist;With armrests;From chair/3-in-1 Stand to Sit: 3: Mod assist;With armrests;To chair/3-in-1 Details for Transfer Assistance: A with initiation. Verbal cues for hand placement and postioning for base of support with sit to stand. A with LLE for stand to sit.       Exercises     Balance Balance Balance Assessed: No   End of Session OT - End of Session Equipment Utilized During Treatment: Gait belt;Other (comment) (BSC, ADL A/E, RW) Activity Tolerance: Patient limited by fatigue;Patient limited by pain Patient left: in chair;with call bell/phone within reach;with nursing in room  GO     Galen Manila 12/24/2012, 12:19 PM

## 2012-12-24 NOTE — Progress Notes (Signed)
Patient with increased pain this session. Will need to work on increasing ambulation and stairs prior to discharge home.  12/24/2012 Fredrich Birks PTA [phone] pager [phone] office

## 2012-12-24 NOTE — Progress Notes (Signed)
Physical Therapy Treatment Patient Details Name: Kenneth Fleming MRN: [id number] DOB: [birth year] Today's Date: 12/24/2012 Time: 4098-1191 PT Time Calculation (min): 36 min  PT Assessment / Plan / Recommendation Comments on Treatment Session  Decreased activity tolerance due to pain. Patient however did attempt ambulation this session and agreeable to transfer to recliner. Will attempt ambulation with chair follow next session.    Follow Up Recommendations  Home health PT     Does the patient have the potential to tolerate intense rehabilitation     Barriers to Discharge        Equipment Recommendations  None recommended by PT    Recommendations for Other Services    Frequency 7X/week   Plan Discharge plan remains appropriate;Frequency remains appropriate    Precautions / Restrictions Precautions Precautions: Knee Required Braces or Orthoses: Knee Immobilizer - Left Knee Immobilizer - Left: On when out of bed or walking Restrictions LLE Weight Bearing: Weight bearing as tolerated   Pertinent Vitals/Pain 10/10 pain. Reposition. Cold applied.    Mobility  Bed Mobility Sit to Supine: 3: Mod assist Details for Bed Mobility Assistance: A to lower LLE to floor and scoot to EOB Transfers Sit to Stand: 3: Mod assist Stand to Sit: 4: Min assist Details for Transfer Assistance: A with initiation. Verbal cues for hand placement and postioning for base of support with sit to stand. A with LLE for stand to sit. Ambulation/Gait Ambulation/Gait Assistance: 4: Min assist Ambulation Distance (Feet): 3 Feet Assistive device: Rolling walker Ambulation/Gait Assistance Details: Patient refusing to ambulate further despite max encouragement and chair follow. Verbal cueing for step length and RW management.  Gait Pattern: Step-to pattern;Decreased step length - right;Decreased step length - left;Antalgic Gait velocity: decreased    Exercises Total Joint Exercises Ankle Circles/Pumps:  AROM;Both;10 reps Quad Sets: AROM;Both;10 reps Short Arc QuadBarbaraann Boys;Left;10 reps Heel Slides: AAROM;Left;10 reps Hip ABduction/ADduction: AAROM;Left;10 reps Straight Leg Raises: AAROM;Left;10 reps   PT Diagnosis:    PT Problem List:   PT Treatment Interventions:     PT Goals Acute Rehab PT Goals PT Goal: Supine/Side to Sit - Progress: Progressing toward goal PT Goal: Sit to Stand - Progress: Progressing toward goal PT Goal: Stand to Sit - Progress: Progressing toward goal PT Transfer Goal: Bed to Chair/Chair to Bed - Progress: Progressing toward goal PT Goal: Ambulate - Progress: Progressing toward goal  Visit Information  Last PT Received On: 12/24/12 Assistance Needed: +1    Subjective Data      Cognition  Overall Cognitive Status: Appears within functional limits for tasks assessed/performed Arousal/Alertness: Awake/alert Orientation Level: Appears intact for tasks assessed Behavior During Session: [facility] for tasks performed    Balance     End of Session PT - End of Session Equipment Utilized During Treatment: Gait belt;Left knee immobilizer Activity Tolerance: Patient limited by pain;Patient tolerated treatment well Patient left: in chair;with call bell/phone within reach   GP     Lazaro Arms 12/24/2012, 8:41 AM

## 2012-12-25 LAB — CBC
Hemoglobin: 8.9 g/dL — ABNORMAL LOW (ref 13.0–17.0)
MCV: 80.4 fL (ref 78.0–100.0)
Platelets: 233 10*3/uL (ref 150–400)
RBC: 3.47 MIL/uL — ABNORMAL LOW (ref 4.22–5.81)
WBC: 9.1 10*3/uL (ref 4.0–10.5)

## 2012-12-25 NOTE — Progress Notes (Signed)
Subjective: 3 Days Post-Op Procedure(s) (LRB): TOTAL KNEE ARTHROPLASTY (Left) Temp taken now - 98 Activity level:  wbat Diet tolerance:  ok Voiding:  ok Patient reports pain as 4 on 0-10 scale.    Objective: Vital signs in last 24 hours: Temp:  [99.6 F (37.6 C)-101.9 F (38.8 C)] 101.9 F (38.8 C) (01/17 0636) Pulse Rate:  [65-90] 65  (01/17 0636) Resp:  [18] 18  (01/17 0636) BP: (108-125)/(55-58) 108/55 mmHg (01/17 0636) SpO2:  [94 %-100 %] 94 % (01/17 0636)Labs:  Basename 12/25/12 0530 12/24/12 0542 12/23/12 1100  HGB 8.9* 9.7* 10.5*    Basename 12/25/12 0530 12/24/12 0542  WBC 9.1 10.6*  RBC 3.47* 3.83*  HCT 27.9* 30.6*  PLT 233 245    Basename 12/23/12 1100  NA 137  K 4.3  CL 101  CO2 27  BUN 13  CREATININE 1.06  GLUCOSE 130*  CALCIUM 9.0   No results found for this basename: LABPT:2,INR:2 in the last 72 hours  Physical Exam:  Neurologically intact ABD soft Neurovascular intact Sensation intact distally Intact pulses distally Dorsiflexion/Plantar flexion intact No cellulitis present Compartment soft  Assessment/Plan:  3 Days Post-Op Procedure(s) (LRB): TOTAL KNEE ARTHROPLASTY (Left) Advance diet Up with therapy D/C IV fluids Discharge home with home health after cleared by therapy today. D/C today  ASA 325 1 po BID x 14 days Incentive spir. At home    Kenneth Fleming R 12/25/2012, 8:17 AM

## 2012-12-25 NOTE — Progress Notes (Signed)
Pt discharged to home accompanied by family. Discharged instructions and rx given and explained and pt stated understanding. Pts IV was removed. Pt left unit in a stable condition via wheelchair.

## 2012-12-25 NOTE — Progress Notes (Signed)
Physical Therapy Treatment Patient Details Name: Kenneth Fleming MRN: [id number] DOB: 23-May-1926 Today's Date: 12/25/2012 Time: 4098-1191 PT Time Calculation (min): 15 min  PT Assessment / Plan / Recommendation Comments on Treatment Session  Pt continues to progress toward goals. Exercises only this pm session due to pt with company and packing for discharge.    Follow Up Recommendations  Home health PT           Equipment Recommendations  None recommended by PT       Frequency 7X/week   Plan Discharge plan remains appropriate;Frequency remains appropriate    Precautions / Restrictions Precautions Precautions: Knee Required Braces or Orthoses: Knee Immobilizer - Left Knee Immobilizer - Left: On when out of bed or walking Restrictions LLE Weight Bearing: Weight bearing as tolerated    Pertinent Vitals/Pain No complaints before, 2/10 after. Premedicated.       Exercises Total Joint Exercises Ankle Circles/Pumps: AROM;Both;15 reps;Supine Quad Sets: AROM;Strengthening;Left;10 reps;Supine Heel Slides: AAROM;Strengthening;Left;10 reps;Supine Hip ABduction/ADduction: AROM;Strengthening;Left;10 reps;Supine Straight Leg Raises: AAROM;Strengthening;Left;10 reps;Supine    PT Goals Acute Rehab PT Goals PT Goal: Perform Home Exercise Program - Progress: Progressing toward goal  Visit Information  Last PT Received On: 12/25/12 Assistance Needed: +1    Subjective Data  Subjective: Reports feeling good right now.   Cognition  Overall Cognitive Status: Appears within functional limits for tasks assessed/performed Arousal/Alertness: Awake/alert Orientation Level: Appears intact for tasks assessed Behavior During Session: [facility] for tasks performed       End of Session PT - End of Session Activity Tolerance: Patient tolerated treatment well Patient left: in chair;with call bell/phone within reach;with family/visitor present   GP     Sallyanne Kuster 12/25/2012, 2:56 PM  Sallyanne Kuster, PTA Office- [phone]

## 2012-12-25 NOTE — Discharge Summary (Signed)
Patient ID: Kenneth Fleming MRN: [id number] DOB/AGE: [birth year] 79 y.o.  Admit date: 12/22/2012 Discharge date: 12/25/2012  Admission Diagnoses:  Principal Problem:  *Left knee DJD   Discharge Diagnoses:  Same  Past Medical History  Diagnosis Date  . Retinal vascular occlusion of right eye     hx of  . Arthritis   . Hypertension     sees Dr. Kirby Funk [phone]    Surgeries: Procedure(s): TOTAL KNEE ARTHROPLASTY on 12/22/2012   Consultants:    Discharged Condition: Improved  Hospital Course: Kenneth Fleming is an 79 y.o. male who was admitted 12/22/2012 for operative treatment ofLeft knee DJD. Patient has severe unremitting pain that affects sleep, daily activities, and work/hobbies. After pre-op clearance the patient was taken to the operating room on 12/22/2012 and underwent  Procedure(s): TOTAL KNEE ARTHROPLASTY.    Patient was given perioperative antibiotics: Anti-infectives     Start     Dose/Rate Route Frequency Ordered Stop   12/22/12 1600   ceFAZolin (ANCEF) IVPB 2 g/50 mL premix        2 g 100 mL/hr over 30 Minutes Intravenous Every 6 hours 12/22/12 1435 12/22/12 2245   12/22/12 0959   ceFAZolin (ANCEF) 2-3 GM-% IVPB SOLR     Comments: HYPES, KAREN: cabinet override         12/22/12 0959 12/22/12 1010   12/21/12 1417   ceFAZolin (ANCEF) IVPB 2 g/50 mL premix  Status:  Discontinued        2 g 100 mL/hr over 30 Minutes Intravenous 60 min pre-op 12/21/12 1417 12/22/12 1429           Patient was given sequential compression devices, early ambulation, and chemoprophylaxis to prevent DVT.  Patient benefited maximally from hospital stay and there were no complications.    Recent vital signs: Patient Vitals for the past 24 hrs:  BP Temp Temp src Pulse Resp SpO2  12/25/12 0636 108/55 mmHg 101.9 F (38.8 C) Oral 65  18  94 %  12/24/12 1357 125/58 mmHg 99.6 F (37.6 C) - 90  18  100 %   Last temp 98.0 12/25/12 at 8am  Recent laboratory studies:  Basename  12/25/12 0530 12/24/12 0542 12/23/12 1100  WBC 9.1 10.6* --  HGB 8.9* 9.7* --  HCT 27.9* 30.6* --  PLT 233 245 --  NA -- -- 137  K -- -- 4.3  CL -- -- 101  CO2 -- -- 27  BUN -- -- 13  CREATININE -- -- 1.06  GLUCOSE -- -- 130*  INR -- -- --  CALCIUM -- -- 9.0     Discharge Medications:     Medication List     As of 12/25/2012  8:21 AM    TAKE these medications         acetaminophen 325 MG tablet   Commonly known as: TYLENOL   Take 650 mg by mouth 2 (two) times daily as needed. For pain      amLODipine-benazepril 5-10 MG per capsule   Commonly known as: LOTREL   Take 1 capsule by mouth daily.      aspirin 325 MG EC tablet   Take 1 tablet (325 mg total) by mouth 2 (two) times daily.      azelastine 137 MCG/SPRAY nasal spray   Commonly known as: ASTELIN   Place 2 sprays into the nose daily. Use in each nostril as directed      finasteride 5 MG tablet   Commonly known  as: PROSCAR   Take 5 mg by mouth daily.      oxyCODONE 5 MG immediate release tablet   Commonly known as: Oxy IR/ROXICODONE   Take 1-2 tablets (5-10 mg total) by mouth every 3 (three) hours as needed.      pravastatin 40 MG tablet   Commonly known as: PRAVACHOL   Take 40 mg by mouth daily.      Tamsulosin HCl 0.4 MG Caps   Commonly known as: FLOMAX   Take 0.4 mg by mouth daily.        Diagnostic Studies: Dg Chest 2 View  12/16/2012  *RADIOLOGY REPORT*  Clinical Data: Preop for knee surgery.  CHEST - 2 VIEW  Comparison: 11/22/2004  Findings: Mild hyperinflation.  Moderate thoracic spondylosis.  Question bone island of the right scapular versus intra-articular loose body projecting inferior to the right coracoid.  Midline trachea.  Mild cardiomegaly with tortuous thoracic aorta. No pleural effusion or pneumothorax.  No congestive failure.  Mild volume loss at the lung bases.  IMPRESSION: Cardiomegaly without congestive failure.   Original Report Authenticated By: Jeronimo Greaves, M.D.     Disposition:  Final discharge disposition not confirmed      Discharge Orders    Future Orders Please Complete By Expires   Diet - low sodium heart healthy      Diet - low sodium heart healthy      Call MD / Call 911      Comments:   If you experience chest pain or shortness of breath, CALL 911 and be transported to the hospital emergency room.  If you develope a fever above 101 F, pus (white drainage) or increased drainage or redness at the wound, or calf pain, call your surgeon's office.   Constipation Prevention      Comments:   Drink plenty of fluids.  Prune juice may be helpful.  You may use a stool softener, such as Colace (over the counter) 100 mg twice a day.  Use MiraLax (over the counter) for constipation as needed.   Increase activity slowly as tolerated      Call MD / Call 911      Comments:   If you experience chest pain or shortness of breath, CALL 911 and be transported to the hospital emergency room.  If you develope a fever above 101 F, pus (white drainage) or increased drainage or redness at the wound, or calf pain, call your surgeon's office.   Constipation Prevention      Comments:   Drink plenty of fluids.  Prune juice may be helpful.  You may use a stool softener, such as Colace (over the counter) 100 mg twice a day.  Use MiraLax (over the counter) for constipation as needed.   Increase activity slowly as tolerated         Follow-up Information    Follow up with Velna Ochs, MD. Call in 2 weeks.   Contact information:   [address] [place] [address] [phone]           Signed: Prince Rome 12/25/2012, 8:21 AM

## 2012-12-25 NOTE — Progress Notes (Addendum)
Physical Therapy Treatment Patient Details Name: Kenneth Fleming MRN: [id number] DOB: 02-24-1926 Today's Date: 12/25/2012 Time: 4010-2725 PT Time Calculation (min): 57 min  PT Assessment / Plan / Recommendation Comments on Treatment Session  Pt with great progress today and at safe mobiltiy level to go home with family assistance and home health services. RN notified.    Follow Up Recommendations  No PT follow up           Equipment Recommendations  Home Health PT      Frequency 7X/week   Plan Discharge plan remains appropriate;Frequency remains appropriate    Precautions / Restrictions Precautions Precautions: Knee Required Braces or Orthoses: Knee Immobilizer - Left Knee Immobilizer - Left: On when out of bed or walking Restrictions LLE Weight Bearing: Weight bearing as tolerated    Pertinent Vitals/Pain 3/10 before, 5/10 after in left knee. Premedicated.    Mobility  Bed Mobility Supine to Sit: 5: Supervision;HOB flat Details for Bed Mobility Assistance: pt able to use a belt to move his left leg to and off edge of bed and then with cues uses his UE's to elevate his trunk into sitting and scoot to edge of bed. Incr time required for max pt ability Transfers Sit to Stand: 5: Supervision;From bed;From chair/3-in-1;With upper extremity assist;With armrests Stand to Sit: 5: Supervision;To chair/3-in-1;With upper extremity assist;With armrests Details for Transfer Assistance: cues for hand placement with sit/stand from bed and to chair on first trial, no cues needed with sit/stand to/from chair no second trial. Ambulation/Gait Ambulation/Gait Assistance: 5: Supervision;6: Modified independent (Device/Increase time) Ambulation Distance (Feet): 140 Feet Assistive device: Rolling walker Ambulation/Gait Assistance Details: mn cues for posture, to incr distance from body to walker front. pt progressed to a better gait pattern with incr bil step length and safer walker use with  time and cues. very slow gait speed. Gait Pattern: Step-to pattern;Decreased stride length;Decreased step length - right;Decreased stance time - left;Antalgic Gait velocity: Decreased Stairs: Yes Stairs Assistance: 4: Min guard (cues for technique/sequence with stairs) Stair Management Technique: One rail Left;Step to pattern;Sideways Number of Stairs: 3     Exercises Total Joint Exercises Ankle Circles/Pumps: AROM;Both;10 reps;Supine Quad Sets: AROM;Strengthening;Left;10 reps;Supine Short Arc Quad: AROM;Strengthening;Left;10 reps;Supine Heel Slides: AAROM;Strengthening;Left;10 reps;Supine Hip ABduction/ADduction: AROM;Strengthening;Left;10 reps;Supine Straight Leg Raises: AAROM;Strengthening;Left;10 reps;Supine Goniometric ROM: supine knee AAROM: 9-70 degrees flexion     PT Goals Acute Rehab PT Goals PT Goal: Supine/Side to Sit - Progress: Progressing toward goal PT Goal: Sit to Stand - Progress: Progressing toward goal PT Goal: Stand to Sit - Progress: Progressing toward goal PT Transfer Goal: Bed to Chair/Chair to Bed - Progress: Progressing toward goal PT Goal: Ambulate - Progress: Progressing toward goal PT Goal: Up/Down Stairs - Progress: Progressing toward goal PT Goal: Perform Home Exercise Program - Progress: Progressing toward goal  Visit Information  Last PT Received On: 12/25/12 Assistance Needed: +1    Subjective Data  Subjective: No new complaints today, agreeable to therapy today.   Cognition  Overall Cognitive Status: Appears within functional limits for tasks assessed/performed Arousal/Alertness: Awake/alert Orientation Level: Appears intact for tasks assessed Behavior During Session: [facility] for tasks performed       End of Session PT - End of Session Equipment Utilized During Treatment: Gait belt;Left knee immobilizer Activity Tolerance: Patient tolerated treatment well Patient left: in chair;with call bell/phone within reach Nurse Communication: Mobility  status CPM Left Knee CPM Left Knee: Off   GP     Sallyanne Kuster 12/25/2012, 12:14 PM  Sallyanne Kuster, PTA Office- [phone]

## 2012-12-26 DIAGNOSIS — Z96659 Presence of unspecified artificial knee joint: Secondary | ICD-10-CM | POA: Diagnosis not present

## 2012-12-26 DIAGNOSIS — Z4801 Encounter for change or removal of surgical wound dressing: Secondary | ICD-10-CM | POA: Diagnosis not present

## 2012-12-26 DIAGNOSIS — Z7982 Long term (current) use of aspirin: Secondary | ICD-10-CM | POA: Diagnosis not present

## 2012-12-26 DIAGNOSIS — Z471 Aftercare following joint replacement surgery: Secondary | ICD-10-CM | POA: Diagnosis not present

## 2012-12-26 DIAGNOSIS — IMO0001 Reserved for inherently not codable concepts without codable children: Secondary | ICD-10-CM | POA: Diagnosis not present

## 2012-12-26 NOTE — Progress Notes (Signed)
12/26/2012 Fredrich Birks PTA [phone] pager [phone] office

## 2012-12-28 DIAGNOSIS — IMO0001 Reserved for inherently not codable concepts without codable children: Secondary | ICD-10-CM | POA: Diagnosis not present

## 2012-12-28 DIAGNOSIS — Z4801 Encounter for change or removal of surgical wound dressing: Secondary | ICD-10-CM | POA: Diagnosis not present

## 2012-12-28 DIAGNOSIS — Z96659 Presence of unspecified artificial knee joint: Secondary | ICD-10-CM | POA: Diagnosis not present

## 2012-12-28 DIAGNOSIS — Z471 Aftercare following joint replacement surgery: Secondary | ICD-10-CM | POA: Diagnosis not present

## 2012-12-28 DIAGNOSIS — Z7982 Long term (current) use of aspirin: Secondary | ICD-10-CM | POA: Diagnosis not present

## 2012-12-29 DIAGNOSIS — Z7982 Long term (current) use of aspirin: Secondary | ICD-10-CM | POA: Diagnosis not present

## 2012-12-29 DIAGNOSIS — IMO0001 Reserved for inherently not codable concepts without codable children: Secondary | ICD-10-CM | POA: Diagnosis not present

## 2012-12-29 DIAGNOSIS — Z96659 Presence of unspecified artificial knee joint: Secondary | ICD-10-CM | POA: Diagnosis not present

## 2012-12-29 DIAGNOSIS — Z471 Aftercare following joint replacement surgery: Secondary | ICD-10-CM | POA: Diagnosis not present

## 2012-12-29 DIAGNOSIS — Z4801 Encounter for change or removal of surgical wound dressing: Secondary | ICD-10-CM | POA: Diagnosis not present

## 2012-12-30 ENCOUNTER — Other Ambulatory Visit (HOSPITAL_COMMUNITY): Payer: Self-pay | Admitting: Orthopaedic Surgery

## 2012-12-30 ENCOUNTER — Ambulatory Visit (HOSPITAL_COMMUNITY)
Admission: RE | Admit: 2012-12-30 | Discharge: 2012-12-30 | Disposition: A | Discharge status: Home / Self Care | Payer: Medicare Other | Source: Ambulatory Visit | Attending: Orthopaedic Surgery | Admitting: Orthopaedic Surgery

## 2012-12-30 DIAGNOSIS — Z7982 Long term (current) use of aspirin: Secondary | ICD-10-CM | POA: Diagnosis not present

## 2012-12-30 DIAGNOSIS — R52 Pain, unspecified: Secondary | ICD-10-CM

## 2012-12-30 DIAGNOSIS — Z471 Aftercare following joint replacement surgery: Secondary | ICD-10-CM | POA: Diagnosis not present

## 2012-12-30 DIAGNOSIS — I824Z9 Acute embolism and thrombosis of unspecified deep veins of unspecified distal lower extremity: Secondary | ICD-10-CM | POA: Insufficient documentation

## 2012-12-30 DIAGNOSIS — M79609 Pain in unspecified limb: Secondary | ICD-10-CM | POA: Diagnosis not present

## 2012-12-30 DIAGNOSIS — Z96659 Presence of unspecified artificial knee joint: Secondary | ICD-10-CM | POA: Diagnosis not present

## 2012-12-30 DIAGNOSIS — IMO0001 Reserved for inherently not codable concepts without codable children: Secondary | ICD-10-CM | POA: Diagnosis not present

## 2012-12-30 DIAGNOSIS — M7989 Other specified soft tissue disorders: Secondary | ICD-10-CM | POA: Diagnosis not present

## 2012-12-30 DIAGNOSIS — Z4801 Encounter for change or removal of surgical wound dressing: Secondary | ICD-10-CM | POA: Diagnosis not present

## 2012-12-30 NOTE — Progress Notes (Addendum)
*  Preliminary Results* Left lower extremity venous duplex completed. Left lower extremity is positive for deep vein thrombosis involving the left posterior tibial and peroneal veins. No evidence of left Baker's cyst. Interstitial fluid noted in the left calf. Preliminary results discussed with Lindwood Qua, PA, patient instructed to go home, continue with aspirin regimen, and follow up with Dr.Dalldorf next week.  12/30/2012 5:55 PM Gertie Fey, RDMS, RDCS

## 2012-12-31 DIAGNOSIS — Z4801 Encounter for change or removal of surgical wound dressing: Secondary | ICD-10-CM | POA: Diagnosis not present

## 2012-12-31 DIAGNOSIS — Z471 Aftercare following joint replacement surgery: Secondary | ICD-10-CM | POA: Diagnosis not present

## 2012-12-31 DIAGNOSIS — Z7982 Long term (current) use of aspirin: Secondary | ICD-10-CM | POA: Diagnosis not present

## 2012-12-31 DIAGNOSIS — Z96659 Presence of unspecified artificial knee joint: Secondary | ICD-10-CM | POA: Diagnosis not present

## 2012-12-31 DIAGNOSIS — IMO0001 Reserved for inherently not codable concepts without codable children: Secondary | ICD-10-CM | POA: Diagnosis not present

## 2013-01-04 DIAGNOSIS — Z4801 Encounter for change or removal of surgical wound dressing: Secondary | ICD-10-CM | POA: Diagnosis not present

## 2013-01-04 DIAGNOSIS — IMO0001 Reserved for inherently not codable concepts without codable children: Secondary | ICD-10-CM | POA: Diagnosis not present

## 2013-01-04 DIAGNOSIS — M1710 Unilateral primary osteoarthritis, unspecified knee: Secondary | ICD-10-CM | POA: Diagnosis not present

## 2013-01-04 DIAGNOSIS — Z96659 Presence of unspecified artificial knee joint: Secondary | ICD-10-CM | POA: Diagnosis not present

## 2013-01-04 DIAGNOSIS — Z471 Aftercare following joint replacement surgery: Secondary | ICD-10-CM | POA: Diagnosis not present

## 2013-01-04 DIAGNOSIS — Z7982 Long term (current) use of aspirin: Secondary | ICD-10-CM | POA: Diagnosis not present

## 2013-01-05 DIAGNOSIS — Z7982 Long term (current) use of aspirin: Secondary | ICD-10-CM | POA: Diagnosis not present

## 2013-01-05 DIAGNOSIS — Z96659 Presence of unspecified artificial knee joint: Secondary | ICD-10-CM | POA: Diagnosis not present

## 2013-01-05 DIAGNOSIS — IMO0001 Reserved for inherently not codable concepts without codable children: Secondary | ICD-10-CM | POA: Diagnosis not present

## 2013-01-05 DIAGNOSIS — Z4801 Encounter for change or removal of surgical wound dressing: Secondary | ICD-10-CM | POA: Diagnosis not present

## 2013-01-05 DIAGNOSIS — Z471 Aftercare following joint replacement surgery: Secondary | ICD-10-CM | POA: Diagnosis not present

## 2013-01-06 DIAGNOSIS — Z96659 Presence of unspecified artificial knee joint: Secondary | ICD-10-CM | POA: Diagnosis not present

## 2013-01-06 DIAGNOSIS — Z471 Aftercare following joint replacement surgery: Secondary | ICD-10-CM | POA: Diagnosis not present

## 2013-01-06 DIAGNOSIS — Z7982 Long term (current) use of aspirin: Secondary | ICD-10-CM | POA: Diagnosis not present

## 2013-01-06 DIAGNOSIS — Z4801 Encounter for change or removal of surgical wound dressing: Secondary | ICD-10-CM | POA: Diagnosis not present

## 2013-01-06 DIAGNOSIS — IMO0001 Reserved for inherently not codable concepts without codable children: Secondary | ICD-10-CM | POA: Diagnosis not present

## 2013-01-07 DIAGNOSIS — Z4801 Encounter for change or removal of surgical wound dressing: Secondary | ICD-10-CM | POA: Diagnosis not present

## 2013-01-07 DIAGNOSIS — IMO0001 Reserved for inherently not codable concepts without codable children: Secondary | ICD-10-CM | POA: Diagnosis not present

## 2013-01-07 DIAGNOSIS — Z471 Aftercare following joint replacement surgery: Secondary | ICD-10-CM | POA: Diagnosis not present

## 2013-01-07 DIAGNOSIS — Z7982 Long term (current) use of aspirin: Secondary | ICD-10-CM | POA: Diagnosis not present

## 2013-01-07 DIAGNOSIS — Z96659 Presence of unspecified artificial knee joint: Secondary | ICD-10-CM | POA: Diagnosis not present

## 2013-01-08 DIAGNOSIS — Z96659 Presence of unspecified artificial knee joint: Secondary | ICD-10-CM | POA: Diagnosis not present

## 2013-01-08 DIAGNOSIS — IMO0001 Reserved for inherently not codable concepts without codable children: Secondary | ICD-10-CM | POA: Diagnosis not present

## 2013-01-08 DIAGNOSIS — Z4801 Encounter for change or removal of surgical wound dressing: Secondary | ICD-10-CM | POA: Diagnosis not present

## 2013-01-08 DIAGNOSIS — Z471 Aftercare following joint replacement surgery: Secondary | ICD-10-CM | POA: Diagnosis not present

## 2013-01-08 DIAGNOSIS — Z7982 Long term (current) use of aspirin: Secondary | ICD-10-CM | POA: Diagnosis not present

## 2013-01-11 DIAGNOSIS — IMO0001 Reserved for inherently not codable concepts without codable children: Secondary | ICD-10-CM | POA: Diagnosis not present

## 2013-01-11 DIAGNOSIS — Z4801 Encounter for change or removal of surgical wound dressing: Secondary | ICD-10-CM | POA: Diagnosis not present

## 2013-01-11 DIAGNOSIS — Z471 Aftercare following joint replacement surgery: Secondary | ICD-10-CM | POA: Diagnosis not present

## 2013-01-11 DIAGNOSIS — Z96659 Presence of unspecified artificial knee joint: Secondary | ICD-10-CM | POA: Diagnosis not present

## 2013-01-11 DIAGNOSIS — Z7982 Long term (current) use of aspirin: Secondary | ICD-10-CM | POA: Diagnosis not present

## 2013-01-13 DIAGNOSIS — Z96659 Presence of unspecified artificial knee joint: Secondary | ICD-10-CM | POA: Diagnosis not present

## 2013-01-13 DIAGNOSIS — IMO0001 Reserved for inherently not codable concepts without codable children: Secondary | ICD-10-CM | POA: Diagnosis not present

## 2013-01-13 DIAGNOSIS — Z471 Aftercare following joint replacement surgery: Secondary | ICD-10-CM | POA: Diagnosis not present

## 2013-01-13 DIAGNOSIS — Z7982 Long term (current) use of aspirin: Secondary | ICD-10-CM | POA: Diagnosis not present

## 2013-01-13 DIAGNOSIS — Z4801 Encounter for change or removal of surgical wound dressing: Secondary | ICD-10-CM | POA: Diagnosis not present

## 2013-01-15 DIAGNOSIS — Z96659 Presence of unspecified artificial knee joint: Secondary | ICD-10-CM | POA: Diagnosis not present

## 2013-01-15 DIAGNOSIS — Z7982 Long term (current) use of aspirin: Secondary | ICD-10-CM | POA: Diagnosis not present

## 2013-01-15 DIAGNOSIS — Z471 Aftercare following joint replacement surgery: Secondary | ICD-10-CM | POA: Diagnosis not present

## 2013-01-15 DIAGNOSIS — IMO0001 Reserved for inherently not codable concepts without codable children: Secondary | ICD-10-CM | POA: Diagnosis not present

## 2013-01-15 DIAGNOSIS — Z4801 Encounter for change or removal of surgical wound dressing: Secondary | ICD-10-CM | POA: Diagnosis not present

## 2013-01-20 DIAGNOSIS — M1710 Unilateral primary osteoarthritis, unspecified knee: Secondary | ICD-10-CM | POA: Diagnosis not present

## 2013-01-20 DIAGNOSIS — M25569 Pain in unspecified knee: Secondary | ICD-10-CM | POA: Diagnosis not present

## 2013-01-25 DIAGNOSIS — H34239 Retinal artery branch occlusion, unspecified eye: Secondary | ICD-10-CM | POA: Diagnosis not present

## 2013-01-25 DIAGNOSIS — H3410 Central retinal artery occlusion, unspecified eye: Secondary | ICD-10-CM | POA: Diagnosis not present

## 2013-01-27 DIAGNOSIS — M1710 Unilateral primary osteoarthritis, unspecified knee: Secondary | ICD-10-CM | POA: Diagnosis not present

## 2013-01-27 DIAGNOSIS — H3410 Central retinal artery occlusion, unspecified eye: Secondary | ICD-10-CM | POA: Diagnosis not present

## 2013-02-08 DIAGNOSIS — M1710 Unilateral primary osteoarthritis, unspecified knee: Secondary | ICD-10-CM | POA: Diagnosis not present

## 2013-02-11 DIAGNOSIS — M47817 Spondylosis without myelopathy or radiculopathy, lumbosacral region: Secondary | ICD-10-CM | POA: Diagnosis not present

## 2013-02-15 DIAGNOSIS — M1710 Unilateral primary osteoarthritis, unspecified knee: Secondary | ICD-10-CM | POA: Diagnosis not present

## 2013-02-18 DIAGNOSIS — M1710 Unilateral primary osteoarthritis, unspecified knee: Secondary | ICD-10-CM | POA: Diagnosis not present

## 2013-02-18 DIAGNOSIS — Z96659 Presence of unspecified artificial knee joint: Secondary | ICD-10-CM | POA: Diagnosis not present

## 2013-02-22 DIAGNOSIS — Z96659 Presence of unspecified artificial knee joint: Secondary | ICD-10-CM | POA: Diagnosis not present

## 2013-02-22 DIAGNOSIS — M47817 Spondylosis without myelopathy or radiculopathy, lumbosacral region: Secondary | ICD-10-CM | POA: Diagnosis not present

## 2013-03-01 DIAGNOSIS — M533 Sacrococcygeal disorders, not elsewhere classified: Secondary | ICD-10-CM | POA: Diagnosis not present

## 2013-03-02 DIAGNOSIS — M47817 Spondylosis without myelopathy or radiculopathy, lumbosacral region: Secondary | ICD-10-CM | POA: Diagnosis not present

## 2013-03-02 DIAGNOSIS — M1710 Unilateral primary osteoarthritis, unspecified knee: Secondary | ICD-10-CM | POA: Diagnosis not present

## 2013-03-02 DIAGNOSIS — Z96659 Presence of unspecified artificial knee joint: Secondary | ICD-10-CM | POA: Diagnosis not present

## 2013-03-05 DIAGNOSIS — M47817 Spondylosis without myelopathy or radiculopathy, lumbosacral region: Secondary | ICD-10-CM | POA: Diagnosis not present

## 2013-03-05 DIAGNOSIS — M1710 Unilateral primary osteoarthritis, unspecified knee: Secondary | ICD-10-CM | POA: Diagnosis not present

## 2013-03-05 DIAGNOSIS — Z96659 Presence of unspecified artificial knee joint: Secondary | ICD-10-CM | POA: Diagnosis not present

## 2013-03-08 DIAGNOSIS — Z96659 Presence of unspecified artificial knee joint: Secondary | ICD-10-CM | POA: Diagnosis not present

## 2013-03-08 DIAGNOSIS — M1710 Unilateral primary osteoarthritis, unspecified knee: Secondary | ICD-10-CM | POA: Diagnosis not present

## 2013-03-09 DIAGNOSIS — M533 Sacrococcygeal disorders, not elsewhere classified: Secondary | ICD-10-CM | POA: Diagnosis not present

## 2013-03-10 DIAGNOSIS — Z96659 Presence of unspecified artificial knee joint: Secondary | ICD-10-CM | POA: Diagnosis not present

## 2013-03-10 DIAGNOSIS — M1710 Unilateral primary osteoarthritis, unspecified knee: Secondary | ICD-10-CM | POA: Diagnosis not present

## 2013-03-18 DIAGNOSIS — M1710 Unilateral primary osteoarthritis, unspecified knee: Secondary | ICD-10-CM | POA: Diagnosis not present

## 2013-03-18 DIAGNOSIS — Z96659 Presence of unspecified artificial knee joint: Secondary | ICD-10-CM | POA: Diagnosis not present

## 2013-03-19 DIAGNOSIS — M25569 Pain in unspecified knee: Secondary | ICD-10-CM | POA: Diagnosis not present

## 2013-03-19 DIAGNOSIS — M25579 Pain in unspecified ankle and joints of unspecified foot: Secondary | ICD-10-CM | POA: Diagnosis not present

## 2013-03-22 DIAGNOSIS — N401 Enlarged prostate with lower urinary tract symptoms: Secondary | ICD-10-CM | POA: Diagnosis not present

## 2013-03-24 DIAGNOSIS — M47817 Spondylosis without myelopathy or radiculopathy, lumbosacral region: Secondary | ICD-10-CM | POA: Diagnosis not present

## 2013-03-29 DIAGNOSIS — R972 Elevated prostate specific antigen [PSA]: Secondary | ICD-10-CM | POA: Diagnosis not present

## 2013-03-29 DIAGNOSIS — N401 Enlarged prostate with lower urinary tract symptoms: Secondary | ICD-10-CM | POA: Diagnosis not present

## 2013-04-28 DIAGNOSIS — I10 Essential (primary) hypertension: Secondary | ICD-10-CM | POA: Diagnosis not present

## 2013-05-26 DIAGNOSIS — I10 Essential (primary) hypertension: Secondary | ICD-10-CM | POA: Diagnosis not present

## 2013-06-16 DIAGNOSIS — M19079 Primary osteoarthritis, unspecified ankle and foot: Secondary | ICD-10-CM | POA: Diagnosis not present

## 2013-06-16 DIAGNOSIS — M25569 Pain in unspecified knee: Secondary | ICD-10-CM | POA: Diagnosis not present

## 2013-06-17 DIAGNOSIS — M47817 Spondylosis without myelopathy or radiculopathy, lumbosacral region: Secondary | ICD-10-CM | POA: Diagnosis not present

## 2013-06-29 DIAGNOSIS — M47817 Spondylosis without myelopathy or radiculopathy, lumbosacral region: Secondary | ICD-10-CM | POA: Diagnosis not present

## 2013-07-12 DIAGNOSIS — M47817 Spondylosis without myelopathy or radiculopathy, lumbosacral region: Secondary | ICD-10-CM | POA: Diagnosis not present

## 2013-08-03 DIAGNOSIS — H4720 Unspecified optic atrophy: Secondary | ICD-10-CM | POA: Diagnosis not present

## 2013-08-03 DIAGNOSIS — H3410 Central retinal artery occlusion, unspecified eye: Secondary | ICD-10-CM | POA: Diagnosis not present

## 2013-08-03 DIAGNOSIS — H43819 Vitreous degeneration, unspecified eye: Secondary | ICD-10-CM | POA: Diagnosis not present

## 2013-08-03 DIAGNOSIS — H34239 Retinal artery branch occlusion, unspecified eye: Secondary | ICD-10-CM | POA: Diagnosis not present

## 2013-08-10 DIAGNOSIS — M47817 Spondylosis without myelopathy or radiculopathy, lumbosacral region: Secondary | ICD-10-CM | POA: Diagnosis not present

## 2013-08-17 DIAGNOSIS — H4720 Unspecified optic atrophy: Secondary | ICD-10-CM | POA: Diagnosis not present

## 2013-08-17 DIAGNOSIS — Z961 Presence of intraocular lens: Secondary | ICD-10-CM | POA: Diagnosis not present

## 2013-08-17 DIAGNOSIS — H43819 Vitreous degeneration, unspecified eye: Secondary | ICD-10-CM | POA: Diagnosis not present

## 2013-08-17 DIAGNOSIS — H34239 Retinal artery branch occlusion, unspecified eye: Secondary | ICD-10-CM | POA: Diagnosis not present

## 2013-08-26 DIAGNOSIS — M47817 Spondylosis without myelopathy or radiculopathy, lumbosacral region: Secondary | ICD-10-CM | POA: Diagnosis not present

## 2013-09-22 DIAGNOSIS — M545 Low back pain: Secondary | ICD-10-CM | POA: Diagnosis not present

## 2013-09-29 DIAGNOSIS — M25579 Pain in unspecified ankle and joints of unspecified foot: Secondary | ICD-10-CM | POA: Diagnosis not present

## 2013-10-07 DIAGNOSIS — M898X9 Other specified disorders of bone, unspecified site: Secondary | ICD-10-CM | POA: Diagnosis not present

## 2013-10-07 DIAGNOSIS — G8918 Other acute postprocedural pain: Secondary | ICD-10-CM | POA: Diagnosis not present

## 2013-10-11 IMAGING — CR DG CHEST 2V
2 series · 2 of 2 positions shown · non-contrast
Comparison: 11/22/2004

CLINICAL DATA: Preop for knee surgery.

CHEST - 2 VIEW

[view not recorded (1 of 2)]
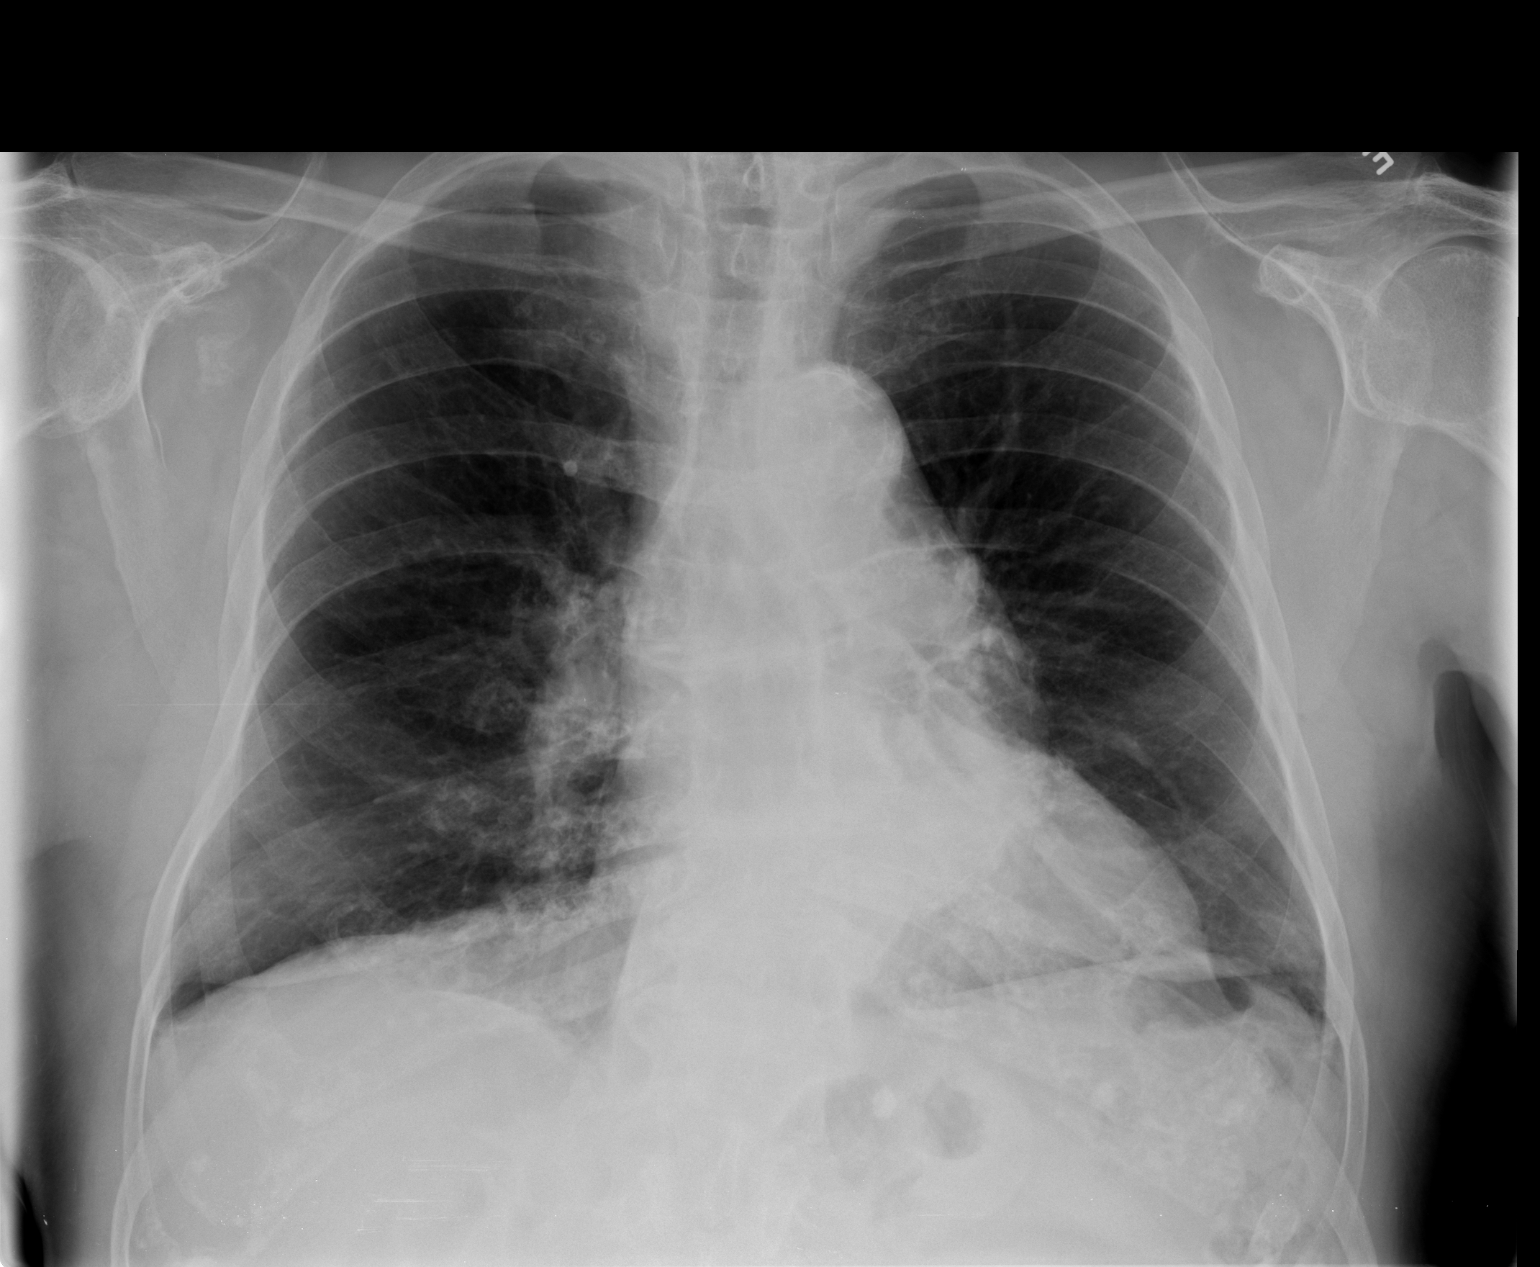

[view not recorded (2 of 2)]
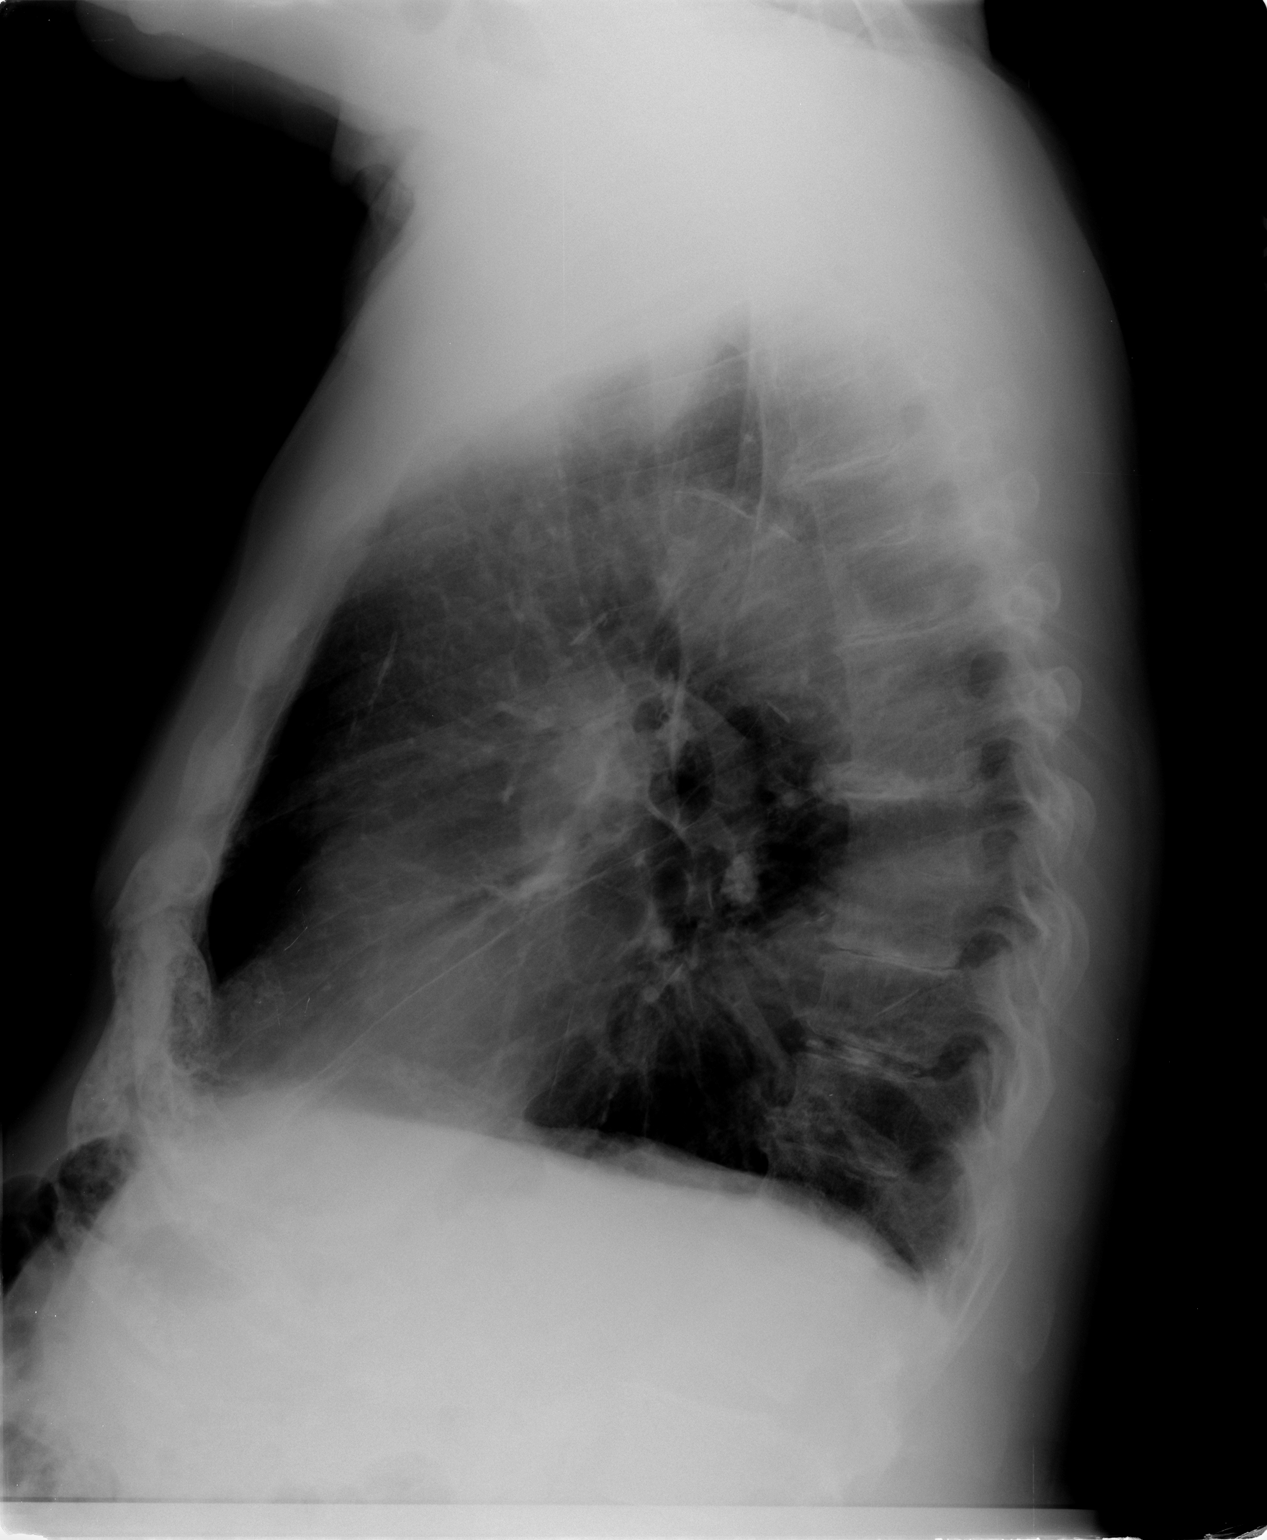

[2 of 2 positions shown; findings below may reference images not displayed]

FINDINGS: Mild hyperinflation.  Moderate thoracic spondylosis.

Question bone island of the right scapular versus intra-articular
loose body projecting inferior to the right coracoid.

Midline trachea.  Mild cardiomegaly with tortuous thoracic aorta.
No pleural effusion or pneumothorax.  No congestive failure.

Mild volume loss at the lung bases.
IMPRESSION: Cardiomegaly without congestive failure.

## 2013-10-14 DIAGNOSIS — M533 Sacrococcygeal disorders, not elsewhere classified: Secondary | ICD-10-CM | POA: Diagnosis not present

## 2013-10-25 DIAGNOSIS — M25579 Pain in unspecified ankle and joints of unspecified foot: Secondary | ICD-10-CM | POA: Diagnosis not present

## 2013-10-27 DIAGNOSIS — M25579 Pain in unspecified ankle and joints of unspecified foot: Secondary | ICD-10-CM | POA: Diagnosis not present

## 2013-11-03 DIAGNOSIS — M25579 Pain in unspecified ankle and joints of unspecified foot: Secondary | ICD-10-CM | POA: Diagnosis not present

## 2013-11-08 DIAGNOSIS — M25579 Pain in unspecified ankle and joints of unspecified foot: Secondary | ICD-10-CM | POA: Diagnosis not present

## 2013-11-09 DIAGNOSIS — Z0000 Encounter for general adult medical examination without abnormal findings: Secondary | ICD-10-CM | POA: Diagnosis not present

## 2013-11-09 DIAGNOSIS — Z23 Encounter for immunization: Secondary | ICD-10-CM | POA: Diagnosis not present

## 2013-11-09 DIAGNOSIS — R413 Other amnesia: Secondary | ICD-10-CM | POA: Diagnosis not present

## 2013-11-09 DIAGNOSIS — E785 Hyperlipidemia, unspecified: Secondary | ICD-10-CM | POA: Diagnosis not present

## 2013-11-09 DIAGNOSIS — Z1331 Encounter for screening for depression: Secondary | ICD-10-CM | POA: Diagnosis not present

## 2013-11-09 DIAGNOSIS — R209 Unspecified disturbances of skin sensation: Secondary | ICD-10-CM | POA: Diagnosis not present

## 2013-11-09 DIAGNOSIS — I10 Essential (primary) hypertension: Secondary | ICD-10-CM | POA: Diagnosis not present

## 2013-11-10 DIAGNOSIS — M25579 Pain in unspecified ankle and joints of unspecified foot: Secondary | ICD-10-CM | POA: Diagnosis not present

## 2013-11-15 DIAGNOSIS — M25579 Pain in unspecified ankle and joints of unspecified foot: Secondary | ICD-10-CM | POA: Diagnosis not present

## 2013-11-17 DIAGNOSIS — M25579 Pain in unspecified ankle and joints of unspecified foot: Secondary | ICD-10-CM | POA: Diagnosis not present

## 2013-11-22 DIAGNOSIS — M25579 Pain in unspecified ankle and joints of unspecified foot: Secondary | ICD-10-CM | POA: Diagnosis not present

## 2013-11-29 DIAGNOSIS — G5600 Carpal tunnel syndrome, unspecified upper limb: Secondary | ICD-10-CM | POA: Diagnosis not present

## 2013-12-15 DIAGNOSIS — M25569 Pain in unspecified knee: Secondary | ICD-10-CM | POA: Diagnosis not present

## 2013-12-15 DIAGNOSIS — M25579 Pain in unspecified ankle and joints of unspecified foot: Secondary | ICD-10-CM | POA: Diagnosis not present

## 2013-12-27 DIAGNOSIS — G5600 Carpal tunnel syndrome, unspecified upper limb: Secondary | ICD-10-CM | POA: Diagnosis not present

## 2013-12-29 DIAGNOSIS — M25579 Pain in unspecified ankle and joints of unspecified foot: Secondary | ICD-10-CM | POA: Diagnosis not present

## 2013-12-29 DIAGNOSIS — G5600 Carpal tunnel syndrome, unspecified upper limb: Secondary | ICD-10-CM | POA: Diagnosis not present

## 2013-12-30 DIAGNOSIS — G5600 Carpal tunnel syndrome, unspecified upper limb: Secondary | ICD-10-CM | POA: Diagnosis not present

## 2014-01-05 DIAGNOSIS — M25579 Pain in unspecified ankle and joints of unspecified foot: Secondary | ICD-10-CM | POA: Diagnosis not present

## 2014-01-05 DIAGNOSIS — G5600 Carpal tunnel syndrome, unspecified upper limb: Secondary | ICD-10-CM | POA: Diagnosis not present

## 2014-01-10 DIAGNOSIS — M25549 Pain in joints of unspecified hand: Secondary | ICD-10-CM | POA: Diagnosis not present

## 2014-01-10 DIAGNOSIS — M25579 Pain in unspecified ankle and joints of unspecified foot: Secondary | ICD-10-CM | POA: Diagnosis not present

## 2014-02-07 DIAGNOSIS — G5600 Carpal tunnel syndrome, unspecified upper limb: Secondary | ICD-10-CM | POA: Diagnosis not present

## 2014-02-07 DIAGNOSIS — M25579 Pain in unspecified ankle and joints of unspecified foot: Secondary | ICD-10-CM | POA: Diagnosis not present

## 2014-03-02 DIAGNOSIS — M6240 Contracture of muscle, unspecified site: Secondary | ICD-10-CM | POA: Diagnosis not present

## 2014-03-02 DIAGNOSIS — M19079 Primary osteoarthritis, unspecified ankle and foot: Secondary | ICD-10-CM | POA: Diagnosis not present

## 2014-03-04 DIAGNOSIS — M47817 Spondylosis without myelopathy or radiculopathy, lumbosacral region: Secondary | ICD-10-CM | POA: Diagnosis not present

## 2014-03-08 DIAGNOSIS — M47817 Spondylosis without myelopathy or radiculopathy, lumbosacral region: Secondary | ICD-10-CM | POA: Diagnosis not present

## 2014-03-10 ENCOUNTER — Encounter (HOSPITAL_BASED_OUTPATIENT_CLINIC_OR_DEPARTMENT_OTHER)
Admission: RE | Admit: 2014-03-10 | Discharge: 2014-03-10 | Disposition: A | Discharge status: Home / Self Care | Payer: Medicare Other | Source: Ambulatory Visit | Attending: Orthopedic Surgery | Admitting: Orthopedic Surgery

## 2014-03-10 ENCOUNTER — Other Ambulatory Visit: Payer: Self-pay

## 2014-03-10 ENCOUNTER — Encounter (HOSPITAL_BASED_OUTPATIENT_CLINIC_OR_DEPARTMENT_OTHER): Payer: Self-pay | Admitting: *Deleted

## 2014-03-10 DIAGNOSIS — Z01810 Encounter for preprocedural cardiovascular examination: Secondary | ICD-10-CM | POA: Insufficient documentation

## 2014-03-10 DIAGNOSIS — Z01812 Encounter for preprocedural laboratory examination: Secondary | ICD-10-CM | POA: Diagnosis not present

## 2014-03-10 LAB — BASIC METABOLIC PANEL
BUN: 24 mg/dL — ABNORMAL HIGH (ref 6–23)
CO2: 22 mEq/L (ref 19–32)
Calcium: 9.2 mg/dL (ref 8.4–10.5)
Chloride: 101 mEq/L (ref 96–112)
Creatinine, Ser: 0.9 mg/dL (ref 0.50–1.35)
GFR calc Af Amer: 87 mL/min — ABNORMAL LOW (ref >90)
GFR calc non Af Amer: 75 mL/min — ABNORMAL LOW (ref >90)
Glucose, Bld: 128 mg/dL — ABNORMAL HIGH (ref 70–99)
Potassium: 4.1 mEq/L (ref 3.7–5.3)
Sodium: 138 mEq/L (ref 137–147)

## 2014-03-10 NOTE — Progress Notes (Signed)
Pt very active for age-he will come in for bmet-had ekg 11/14-called for notes No cardiac or resp problems

## 2014-03-16 ENCOUNTER — Other Ambulatory Visit: Payer: Self-pay | Admitting: Orthopedic Surgery

## 2014-03-17 ENCOUNTER — Encounter (HOSPITAL_BASED_OUTPATIENT_CLINIC_OR_DEPARTMENT_OTHER)
Admission: RE | Disposition: A | Discharge status: Home / Self Care | Payer: Self-pay | Source: Ambulatory Visit | Attending: Orthopedic Surgery

## 2014-03-17 ENCOUNTER — Ambulatory Visit (HOSPITAL_BASED_OUTPATIENT_CLINIC_OR_DEPARTMENT_OTHER)
Admission: RE | Admit: 2014-03-17 | Discharge: 2014-03-17 | Disposition: A | Discharge status: Home / Self Care | Payer: Medicare Other | Source: Ambulatory Visit | Attending: Orthopedic Surgery | Admitting: Orthopedic Surgery

## 2014-03-17 ENCOUNTER — Encounter (HOSPITAL_BASED_OUTPATIENT_CLINIC_OR_DEPARTMENT_OTHER): Payer: Self-pay | Admitting: *Deleted

## 2014-03-17 ENCOUNTER — Ambulatory Visit (HOSPITAL_BASED_OUTPATIENT_CLINIC_OR_DEPARTMENT_OTHER): Payer: Medicare Other | Admitting: Certified Registered"

## 2014-03-17 ENCOUNTER — Encounter (HOSPITAL_BASED_OUTPATIENT_CLINIC_OR_DEPARTMENT_OTHER): Payer: Medicare Other | Admitting: Certified Registered"

## 2014-03-17 DIAGNOSIS — M19071 Primary osteoarthritis, right ankle and foot: Secondary | ICD-10-CM

## 2014-03-17 DIAGNOSIS — N400 Enlarged prostate without lower urinary tract symptoms: Secondary | ICD-10-CM | POA: Diagnosis not present

## 2014-03-17 DIAGNOSIS — M6240 Contracture of muscle, unspecified site: Secondary | ICD-10-CM | POA: Diagnosis not present

## 2014-03-17 DIAGNOSIS — Z87891 Personal history of nicotine dependence: Secondary | ICD-10-CM | POA: Insufficient documentation

## 2014-03-17 DIAGNOSIS — Z96659 Presence of unspecified artificial knee joint: Secondary | ICD-10-CM | POA: Diagnosis not present

## 2014-03-17 DIAGNOSIS — I10 Essential (primary) hypertension: Secondary | ICD-10-CM | POA: Insufficient documentation

## 2014-03-17 DIAGNOSIS — M25579 Pain in unspecified ankle and joints of unspecified foot: Secondary | ICD-10-CM | POA: Diagnosis not present

## 2014-03-17 DIAGNOSIS — M19079 Primary osteoarthritis, unspecified ankle and foot: Secondary | ICD-10-CM | POA: Diagnosis not present

## 2014-03-17 DIAGNOSIS — G8918 Other acute postprocedural pain: Secondary | ICD-10-CM | POA: Diagnosis not present

## 2014-03-17 HISTORY — DX: Presence of spectacles and contact lenses: Z97.3

## 2014-03-17 HISTORY — DX: Presence of external hearing-aid: Z97.4

## 2014-03-17 HISTORY — PX: GASTROC RECESSION EXTREMITY: SHX6262

## 2014-03-17 HISTORY — PX: ANKLE FUSION: SHX881

## 2014-03-17 HISTORY — DX: Presence of dental prosthetic device (complete) (partial): Z97.2

## 2014-03-17 HISTORY — DX: Benign prostatic hyperplasia without lower urinary tract symptoms: N40.0

## 2014-03-17 LAB — POCT HEMOGLOBIN-HEMACUE: HEMOGLOBIN: 12.3 g/dL — AB (ref 13.0–17.0)

## 2014-03-17 SURGERY — ARTHRODESIS ANKLE
Anesthesia: General | Site: Leg Lower | Laterality: Right

## 2014-03-17 MED ORDER — FENTANYL CITRATE 0.05 MG/ML IJ SOLN
INTRAMUSCULAR | Status: AC
Start: 2014-03-17 — End: 2014-03-17
  Filled 2014-03-17: qty 2

## 2014-03-17 MED ORDER — FENTANYL CITRATE 0.05 MG/ML IJ SOLN
50.0000 ug | INTRAMUSCULAR | Status: DC | PRN
  Administered 2014-03-17: 100 ug via INTRAVENOUS

## 2014-03-17 MED ORDER — CHLORHEXIDINE GLUCONATE 4 % EX LIQD: 60.0000 mL | Freq: Once | CUTANEOUS | Status: DC

## 2014-03-17 MED ORDER — DEXAMETHASONE SODIUM PHOSPHATE 10 MG/ML IJ SOLN
INTRAMUSCULAR | Status: DC | PRN
Start: 2014-03-17 — End: 2014-03-17
  Administered 2014-03-17: 8 mg via INTRAVENOUS

## 2014-03-17 MED ORDER — BUPIVACAINE HCL (PF) 0.5 % IJ SOLN
INTRAMUSCULAR | Status: AC
  Filled 2014-03-17: qty 30

## 2014-03-17 MED ORDER — MIDAZOLAM HCL 2 MG/2ML IJ SOLN
1.0000 mg | INTRAMUSCULAR | Status: DC | PRN
  Administered 2014-03-17: 2 mg via INTRAVENOUS

## 2014-03-17 MED ORDER — MIDAZOLAM HCL 2 MG/2ML IJ SOLN
INTRAMUSCULAR | Status: AC
  Filled 2014-03-17: qty 2

## 2014-03-17 MED ORDER — BUPIVACAINE-EPINEPHRINE PF 0.5-1:200000 % IJ SOLN
INTRAMUSCULAR | Status: DC | PRN
  Administered 2014-03-17: 20 mL via PERINEURAL

## 2014-03-17 MED ORDER — FENTANYL CITRATE 0.05 MG/ML IJ SOLN: 25.0000 ug | INTRAMUSCULAR | Status: DC | PRN

## 2014-03-17 MED ORDER — BUPIVACAINE-EPINEPHRINE PF 0.5-1:200000 % IJ SOLN
INTRAMUSCULAR | Status: AC
  Filled 2014-03-17: qty 30

## 2014-03-17 MED ORDER — PROPOFOL 10 MG/ML IV BOLUS
INTRAVENOUS | Status: DC | PRN
  Administered 2014-03-17: 100 mg via INTRAVENOUS

## 2014-03-17 MED ORDER — OXYCODONE HCL 5 MG PO TABS: 5.0000 mg | ORAL_TABLET | Freq: Four times a day (QID) | ORAL | Status: AC | PRN

## 2014-03-17 MED ORDER — ROPIVACAINE HCL 5 MG/ML IJ SOLN
INTRAMUSCULAR | Status: DC | PRN
  Administered 2014-03-17: 20 mL via PERINEURAL

## 2014-03-17 MED ORDER — ASPIRIN EC 325 MG PO TBEC: 325.0000 mg | DELAYED_RELEASE_TABLET | Freq: Every day | ORAL | Status: AC

## 2014-03-17 MED ORDER — FENTANYL CITRATE 0.05 MG/ML IJ SOLN
INTRAMUSCULAR | Status: AC
  Filled 2014-03-17: qty 6

## 2014-03-17 MED ORDER — ONDANSETRON HCL 4 MG/2ML IJ SOLN: 4.0000 mg | Freq: Once | INTRAMUSCULAR | Status: DC | PRN

## 2014-03-17 MED ORDER — CEFAZOLIN SODIUM-DEXTROSE 2-3 GM-% IV SOLR
2.0000 g | INTRAVENOUS | Status: AC
  Administered 2014-03-17: 2 g via INTRAVENOUS

## 2014-03-17 MED ORDER — 0.9 % SODIUM CHLORIDE (POUR BTL) OPTIME
TOPICAL | Status: DC | PRN
  Administered 2014-03-17: 300 mL

## 2014-03-17 MED ORDER — LIDOCAINE HCL (CARDIAC) 20 MG/ML IV SOLN
INTRAVENOUS | Status: DC | PRN
  Administered 2014-03-17: 30 mg via INTRAVENOUS

## 2014-03-17 MED ORDER — PROPOFOL 10 MG/ML IV EMUL
INTRAVENOUS | Status: AC
  Filled 2014-03-17: qty 50

## 2014-03-17 MED ORDER — SODIUM CHLORIDE 0.9 % IV SOLN: INTRAVENOUS | Status: DC

## 2014-03-17 MED ORDER — BACITRACIN ZINC 500 UNIT/GM EX OINT
TOPICAL_OINTMENT | CUTANEOUS | Status: AC
  Filled 2014-03-17: qty 28.35

## 2014-03-17 MED ORDER — CEFAZOLIN SODIUM-DEXTROSE 2-3 GM-% IV SOLR
INTRAVENOUS | Status: AC
  Filled 2014-03-17: qty 50

## 2014-03-17 MED ORDER — ONDANSETRON HCL 4 MG/2ML IJ SOLN
INTRAMUSCULAR | Status: DC | PRN
  Administered 2014-03-17: 4 mg via INTRAVENOUS

## 2014-03-17 MED ORDER — LACTATED RINGERS IV SOLN
INTRAVENOUS | Status: DC
  Administered 2014-03-17: 20 mL/h via INTRAVENOUS
  Administered 2014-03-17: 11:00:00 via INTRAVENOUS

## 2014-03-17 MED ORDER — BACITRACIN ZINC 500 UNIT/GM EX OINT
TOPICAL_OINTMENT | CUTANEOUS | Status: DC | PRN
  Administered 2014-03-17: 1 via TOPICAL

## 2014-03-17 SURGICAL SUPPLY — 93 items
BANDAGE ESMARK 6X9 LF (GAUZE/BANDAGES/DRESSINGS) ×2 IMPLANT
BIT DRILL 2.4 AO COUPLING CANN (BIT) ×2 IMPLANT
BIT DRILL 2.5X2.75 QC CALB (BIT) ×2 IMPLANT
BIT DRILL SOLID 2.5X110 (BIT) IMPLANT
BIT DRILL SOLID 3.5X110 (BIT) IMPLANT
BLADE AVERAGE 25MMX9MM (BLADE)
BLADE AVERAGE 25X9 (BLADE) IMPLANT
BLADE MICRO SAGITTAL (BLADE) IMPLANT
BLADE SURG 15 STRL LF DISP TIS (BLADE) ×4 IMPLANT
BLADE SURG 15 STRL SS (BLADE) ×8
BNDG CMPR 9X6 STRL LF SNTH (GAUZE/BANDAGES/DRESSINGS) ×2
BNDG COHESIVE 4X5 TAN STRL (GAUZE/BANDAGES/DRESSINGS) ×4 IMPLANT
BNDG COHESIVE 6X5 TAN STRL LF (GAUZE/BANDAGES/DRESSINGS) ×4 IMPLANT
BNDG ESMARK 6X9 LF (GAUZE/BANDAGES/DRESSINGS) ×4
BOOT STEPPER DURA MED (SOFTGOODS) IMPLANT
CHLORAPREP W/TINT 26ML (MISCELLANEOUS) ×4 IMPLANT
CLOSURE WOUND 1/2 X4 (GAUZE/BANDAGES/DRESSINGS)
COVER MAYO STAND STRL (DRAPES) IMPLANT
COVER TABLE BACK 60X90 (DRAPES) ×6 IMPLANT
CUFF TOURNIQUET SINGLE 34IN LL (TOURNIQUET CUFF) ×4 IMPLANT
DECANTER SPIKE VIAL GLASS SM (MISCELLANEOUS) IMPLANT
DRAPE C-ARM 42X72 X-RAY (DRAPES) ×2 IMPLANT
DRAPE C-ARMOR (DRAPES) ×2 IMPLANT
DRAPE EXTREMITY T 121X128X90 (DRAPE) ×4 IMPLANT
DRAPE U-SHAPE 47X51 STRL (DRAPES) ×4 IMPLANT
DRAPE UTILITY XL STRL (DRAPES) IMPLANT
DRSG ADAPTIC 3X8 NADH LF (GAUZE/BANDAGES/DRESSINGS) ×2 IMPLANT
DRSG EMULSION OIL 3X3 NADH (GAUZE/BANDAGES/DRESSINGS) ×2 IMPLANT
DRSG PAD ABDOMINAL 8X10 ST (GAUZE/BANDAGES/DRESSINGS) ×8 IMPLANT
DURA STEPPER LG (CAST SUPPLIES) IMPLANT
DURA STEPPER XL (SOFTGOODS) IMPLANT
ELECT REM PT RETURN 9FT ADLT (ELECTROSURGICAL) ×4
ELECTRODE REM PT RTRN 9FT ADLT (ELECTROSURGICAL) ×2 IMPLANT
GLOVE BIO SURGEON STRL SZ8 (GLOVE) ×4 IMPLANT
GLOVE BIOGEL PI IND STRL 7.0 (GLOVE) IMPLANT
GLOVE BIOGEL PI IND STRL 7.5 (GLOVE) ×2 IMPLANT
GLOVE BIOGEL PI IND STRL 8 (GLOVE) ×2 IMPLANT
GLOVE BIOGEL PI INDICATOR 7.0 (GLOVE) ×2
GLOVE BIOGEL PI INDICATOR 7.5 (GLOVE)
GLOVE BIOGEL PI INDICATOR 8 (GLOVE) ×2
GLOVE ECLIPSE 6.5 STRL STRAW (GLOVE) ×2 IMPLANT
GLOVE ECLIPSE 7.0 STRL STRAW (GLOVE) ×2 IMPLANT
GLOVE EXAM NITRILE MD LF STRL (GLOVE) ×2 IMPLANT
GOWN STRL REUS W/ TWL LRG LVL3 (GOWN DISPOSABLE) ×4 IMPLANT
GOWN STRL REUS W/ TWL XL LVL3 (GOWN DISPOSABLE) ×2 IMPLANT
GOWN STRL REUS W/TWL LRG LVL3 (GOWN DISPOSABLE) ×4
GOWN STRL REUS W/TWL XL LVL3 (GOWN DISPOSABLE) ×4
K-WIRE TROC 1.25X150 (WIRE) ×4
KIT ASP BONE MRW 60CC (KITS) IMPLANT
KWIRE TROC 1.25X150 (WIRE) IMPLANT
NEEDLE HYPO 22GX1.5 SAFETY (NEEDLE) IMPLANT
NS IRRIG 1000ML POUR BTL (IV SOLUTION) ×4 IMPLANT
PACK BASIN DAY SURGERY FS (CUSTOM PROCEDURE TRAY) ×4 IMPLANT
PAD CAST 4YDX4 CTTN HI CHSV (CAST SUPPLIES) ×4 IMPLANT
PADDING CAST COTTON 4X4 STRL (CAST SUPPLIES) ×4
PADDING CAST COTTON 6X4 STRL (CAST SUPPLIES) ×4 IMPLANT
PENCIL BUTTON HOLSTER BLD 10FT (ELECTRODE) ×4 IMPLANT
PLATE LOCK COMP LG (Plate) ×2 IMPLANT
PUTTY DBM STAGRAFT 2CC (Putty) ×2 IMPLANT
SANITIZER HAND PURELL 535ML FO (MISCELLANEOUS) ×4 IMPLANT
SCREW CANN 5.0X30MM (Screw) ×2 IMPLANT
SCREW CANN 5.0X50MM (Screw) ×4 IMPLANT
SCREW CANN PT 50X5XCANN NS MTP (Screw) ×2 IMPLANT
SCREW CANN PT 5X50 NS (Screw) IMPLANT
SCREW CORT T15 30X3.5XST LCK (Screw) IMPLANT
SCREW CORTICAL 3.5X30MM (Screw) ×8 IMPLANT
SCREW LOCK 3.5X26 DIST TIB (Screw) ×2 IMPLANT
SCREW LOCK CORT STAR 3.5X28 (Screw) ×2 IMPLANT
SHEET MEDIUM DRAPE 40X70 STRL (DRAPES) ×4 IMPLANT
SLEEVE SCD COMPRESS KNEE MED (MISCELLANEOUS) ×4 IMPLANT
SPLINT FAST PLASTER 5X30 (CAST SUPPLIES) ×40
SPLINT PLASTER CAST FAST 5X30 (CAST SUPPLIES) ×40 IMPLANT
SPONGE GAUZE 4X4 12PLY (GAUZE/BANDAGES/DRESSINGS) ×4 IMPLANT
SPONGE LAP 18X18 X RAY DECT (DISPOSABLE) ×4 IMPLANT
STOCKINETTE 6  STRL (DRAPES) ×2
STOCKINETTE 6 STRL (DRAPES) ×2 IMPLANT
STRIP CLOSURE SKIN 1/2X4 (GAUZE/BANDAGES/DRESSINGS) IMPLANT
SUCTION FRAZIER TIP 10 FR DISP (SUCTIONS) ×4 IMPLANT
SUT ETHILON 3 0 PS 1 (SUTURE) ×4 IMPLANT
SUT MNCRL AB 3-0 PS2 18 (SUTURE) ×4 IMPLANT
SUT VIC AB 0 SH 27 (SUTURE) IMPLANT
SUT VIC AB 2-0 SH 27 (SUTURE) ×4
SUT VIC AB 2-0 SH 27XBRD (SUTURE) IMPLANT
SUT VICRYL 4-0 PS2 18IN ABS (SUTURE) IMPLANT
SYR BULB 3OZ (MISCELLANEOUS) ×4 IMPLANT
SYR CONTROL 10ML LL (SYRINGE) IMPLANT
TOWEL OR 17X24 6PK STRL BLUE (TOWEL DISPOSABLE) ×6 IMPLANT
TOWEL OR NON WOVEN STRL DISP B (DISPOSABLE) IMPLANT
TUBE CONNECTING 20'X1/4 (TUBING) ×1
TUBE CONNECTING 20X1/4 (TUBING) ×3 IMPLANT
UNDERPAD 30X30 INCONTINENT (UNDERPADS AND DIAPERS) ×4 IMPLANT
WASHER FLAT 4.0 (Washer) ×2 IMPLANT
YANKAUER SUCT BULB TIP NO VENT (SUCTIONS) IMPLANT

## 2014-03-17 NOTE — H&P (Signed)
Kenneth Fleming is an 80 y.o. male.   Chief Complaint: right foot pain HPI:  80 y/o male without significant PMH presents today for talonavicular joint arthrodesis to treat his severe right talonavicular joint arthritis and chronic pain from this condition.  Past Medical History  Diagnosis Date  . Retinal vascular occlusion of right eye     hx of  . Arthritis   . Hypertension     sees Dr. Lavone Orn [phone]  . Wears glasses   . Wears dentures     bottom  . Wears hearing aid     both ears  . BPH (benign prostatic hypertrophy)     Past Surgical History  Procedure Laterality Date  . Total knee arthroplasty  1992    right knee  . Foot surgery  1990    left  . Eye surgery      bilaterally  . Vasectomy    . Total knee arthroplasty  12/22/2012    Procedure: TOTAL KNEE ARTHROPLASTY;  Surgeon: Hessie Dibble, MD;  Location: [place];  Service: Orthopedics;  Laterality: Left;  . Hernia repair      bilateral inguinal, and umbilical  . Tonsillectomy    . Colonoscopy    . Heel spur surgery      rt    History reviewed. No pertinent family history. Social History:  reports that he quit smoking about 43 years ago. He does not have any smokeless tobacco history on file. He reports that he drinks alcohol. He reports that he does not use illicit drugs.  Allergies: No Known Allergies  Medications Prior to Admission  Medication Sig Dispense Refill  . acetaminophen (TYLENOL) 325 MG tablet Take 650 mg by mouth 2 (two) times daily as needed. For pain      . amLODipine-benazepril (LOTREL) 5-10 MG per capsule Take 1 capsule by mouth daily.      Marland Kitchen azelastine (ASTELIN) 137 MCG/SPRAY nasal spray Place 2 sprays into the nose daily. Use in each nostril as directed      . finasteride (PROSCAR) 5 MG tablet Take 5 mg by mouth daily.      . pravastatin (PRAVACHOL) 40 MG tablet Take 40 mg by mouth daily.      . Tamsulosin HCl (FLOMAX) 0.4 MG CAPS Take 0.4 mg by mouth daily.        Results for  orders placed during the hospital encounter of 03/17/14 (from the past 48 hour(s))  POCT HEMOGLOBIN-HEMACUE     Status: Abnormal   Collection Time    03/17/14  8:12 AM      Result Value Ref Range   Hemoglobin 12.3 (*) 13.0 - 17.0 g/dL   No results found.  ROS  No recent f/c/n/v/wt loss  Blood pressure 95/42, pulse 59, temperature 97.9 F (36.6 C), temperature source Oral, resp. rate 17, height 5\' 6"  (1.676 m), weight 78.019 kg (172 lb), SpO2 100.00%. Physical Exam  wn wd elderly male in nad.  A and O x 4.  Mood and affect normal.  EOMI.  Resp unlabored.  Right ankle and foot with healthy skin.  No lymphadenopathy.  1+ dp and pt pulses.  5/5 strength in PF and DF of the ankle.  Sens to LT intact dorsally and platnarly.  TTP at TN joint dorsally.  Pt lacks 5-10 of reaching neutral DF with his knee extended.  Assessment/Plan Right talonavicular joint arthritis and tight heelcord.  To OR for gastroc recession and talonavicular joint arthrodesis.  The  risks and benefits of the alternative treatment options have been discussed in detail.  The patient wishes to proceed with surgery and specifically understands risks of bleeding, infection, nerve damage, blood clots, need for additional surgery, amputation and death.  Wylene Simmer 21-Mar-2014, 9:00 AM

## 2014-03-17 NOTE — Anesthesia Postprocedure Evaluation (Signed)
  Anesthesia Post-op Note  Patient: Kenneth Fleming  Procedure(s) Performed: Procedure(s): RIGHT TALONAVICULAR ARTHRODESIS   (Right) RIGHT GASTROC RECESSION  (Right)  Patient Location: PACU  Anesthesia Type:GA combined with regional for post-op pain  Level of Consciousness: awake, alert  and oriented  Airway and Oxygen Therapy: Patient Spontanous Breathing  Post-op Pain: none  Post-op Assessment: Post-op Vital signs reviewed  Post-op Vital Signs: Reviewed  Last Vitals:  Filed Vitals:   03/17/14 1115  BP: 136/66  Pulse: 70  Temp:   Resp: 18    Complications: No apparent anesthesia complications

## 2014-03-17 NOTE — Brief Op Note (Signed)
03/17/2014  10:49 AM  PATIENT:  Kenneth Fleming  80 y.o. male  PRE-OPERATIVE DIAGNOSIS:  RIGHT TALONAVICULAR ARTHRITIS and tight right heelcord  POST-OPERATIVE DIAGNOSIS:  same  Procedure(s): 1.  RIGHT TALONAVICULAR ARTHRODESIS   2.  RIGHT GASTROC RECESSION through a separate incision 3.  Right foot ap, lateral and oblique radiographs  SURGEON:  Wylene Simmer, MD  ASSISTANT: n/a  ANESTHESIA:   General, regional  EBL:  minimal   TOURNIQUET:  1:10 at 977 mm Hg  COMPLICATIONS:  None apparent  DISPOSITION:  Extubated, awake and stable to recovery.  DICTATION ID:  [id number]

## 2014-03-17 NOTE — Transfer of Care (Signed)
Immediate Anesthesia Transfer of Care Note  Patient: Kenneth Fleming  Procedure(s) Performed: Procedure(s): RIGHT TALONAVICULAR ARTHRODESIS   (Right) RIGHT GASTROC RECESSION  (Right)  Patient Location: PACU  Anesthesia Type:GA combined with regional for post-op pain  Level of Consciousness: awake, alert , oriented and patient cooperative  Airway & Oxygen Therapy: Patient Spontanous Breathing and Patient connected to face mask oxygen  Post-op Assessment: Report given to PACU RN and Post -op Vital signs reviewed and stable  Post vital signs: Reviewed and stable  Complications: No apparent anesthesia complications

## 2014-03-17 NOTE — Op Note (Signed)
NAME:  FORDYCE, LEPAK                  ACCOUNT NO.:  [id number]  MEDICAL RECORD NO.:  [id number]  LOCATION:                                 FACILITY:  PHYSICIAN:  Wylene Simmer, MD             DATE OF BIRTH:  DATE OF PROCEDURE:  03/17/2014 DATE OF DISCHARGE:                              OPERATIVE REPORT   PREOPERATIVE DIAGNOSES: 1. Right talonavicular joint arthritis. 2. Tight right heel cord.  POSTOPERATIVE DIAGNOSES: 1. Right talonavicular joint arthrodesis. 2. Right gastrocnemius recession through a separate incision. 3. Right foot anterior-posterior lateral and oblique radiographs.  SURGEON:  Wylene Simmer, MD.  ANESTHESIA:  General, regional.  ESTIMATED BLOOD LOSS:  Minimal.  TOURNIQUET TIME:  1 hour and 10 minutes at 220 mmHg.  COMPLICATIONS:  None apparent.  DISPOSITION:  Extubated, awake, and stable to recovery.  INDICATIONS FOR PROCEDURE:  The patient is an 80 year old male with past medical history significant for left talonavicular joint arthritis.  He has developed a painful arthritis of his right talonavicular joint, as well as a tight right heel cord.  He presents now for operative treatment having failed multiple steroid injections, as well as orthotic treatment and shoe wear modifications.  He understands the risks and benefits, the alternative treatment options and elects surgical treatment.  He specifically understands risks of bleeding, infection, nerve damage, blood clots, need for additional surgery, amputation, and death.  PROCEDURE IN DETAIL:  After preoperative consent was obtained, the correct operative site was identified.  The patient was brought to the operating room and placed supine on the operating table.  General anesthesia was induced.  Preoperative antibiotics were administered. Surgical time-out was taken.  The right lower extremity was prepped and draped in standard sterile fashion with tourniquet around the thigh. The extremity was  exsanguinated.  The tourniquet was inflated to 220 mmHg.  A longitudinal incision was then made over the medial calf. Sharp dissection was carried down through skin subcutaneous tissue. Care was taken to protect the saphenous vein and nerve.  The superficial fascia was incised.  The gastrocnemius tendon was identified.  Sural nerve was identified and protected throughout the operation. Gastrocnemius tendon was then divided from medial to lateral in its entirety under direct vision.  The ankle was then dorsiflexed 30 degrees with the knee extended.  The wound was irrigated copiously.  Inverted simple sutures of 3-0 Monocryl were used to close subcutaneous tissue, running 3-0 nylon was used to close the skin incision.  Attention was then turned to the dorsum of the foot where a longitudinal incision was made over the talonavicular joint.  Sharp dissection was carried down through skin, subcutaneous tissue.  The interval between the tibialis anterior and extensor hallucis longus tendon was developed. The neurovascular bundle was mobilized and retracted laterally.  Port talonavicular joint capsule was elevated medially and laterally exposing the talonavicular joint.  Immediately evident was a nearly complete absence of articular cartilage with bone spurs dorsally.  The remaining articular cartilage was removed with curettes and rongeurs.  Wound was irrigated copiously.  The articular surface on both sides of the joint was perforated with  a 2.5-mm drill bit over the entire surface area of the joint.  A quarter-inch curved osteotome was then used to break up the remaining subchondral bone.  Approximately 1.5 mL of STA-graft demineralized bone matrix was then packed into the joint.  The joint was reduced.  K-wires were then placed across the joint obliquely from the medial aspect of the talonavicular joint at the lateral aspect.  AP and lateral radiographs confirmed appropriate position of both  K-wires. Stab incision was made.  The medial K-wire 5 mm partially threaded. Cannulated screw was then inserted across and the talonavicular joint was noted to compress the joint appropriately.  A second 5 mm partially threaded screw was inserted over the lateral K-wire and again was noted to compress the joint appropriately.  Both screws had appropriate purchase.  A closed locking plate was selected from the ALPS foot plate set.  This was applied dorsally across the talonavicular joint.  It was secured with 2 nonlocking 3.5-mm fully-threaded screws and 2 locking 3.5- mm fully-threaded screws.  AP, oblique, and lateral radiographs shows appropriate position and length of all hardware and appropriate compression of the talonavicular joint.  Wound was irrigated copiously. The dorsal joint capsule was repaired with 2-0 simple sutures of Vicryl. The retinaculum was similarly repaired.  Subcutaneous tissue was closed with Monocryl.  The skin was closed with a running nylon sterile dressings were applied followed by a well-padded short-leg splint. Tourniquet was released at 1 hour and 10 minutes.  After application of the dressings, the patient was awakened from anesthesia and transported to the recovery room in stable condition.  FOLLOWUP PLAN:  The patient will be nonweightbearing on the right lower extremity.  He will follow up with me in 2 weeks for suture  removal and conversion to a cast.  He will start aspirin daily for DVT prophylaxis.  Radiographs; AP, lateral, and oblique radiographs of the foot were obtained intraoperatively.  These show interval arthrodesis of the right talonavicular joint, with appropriately positioned hardware.  No other acute injuries noted.     Wylene Simmer, MD     JH/MEDQ  D:  03/17/2014  T:  03/17/2014  Job:  [id number]

## 2014-03-17 NOTE — Anesthesia Preprocedure Evaluation (Signed)
Anesthesia Evaluation  Patient identified by MRN, date of birth, ID band Patient awake    Reviewed: Allergy & Precautions, H&P , NPO status , Patient's Chart, lab work & pertinent test results  Airway Mallampati: I  TM Distance: >3 FB Neck ROM: Full    Dental  (+) Teeth Intact, Dental Advisory Given   Pulmonary former smoker,  breath sounds clear to auscultation        Cardiovascular hypertension, Pt. on medications Rhythm:Regular Rate:Normal     Neuro/Psych    GI/Hepatic   Endo/Other    Renal/GU      Musculoskeletal   Abdominal   Peds  Hematology   Anesthesia Other Findings   Reproductive/Obstetrics                             Anesthesia Physical Anesthesia Plan  ASA: II  Anesthesia Plan: General   Post-op Pain Management:    Induction: Intravenous  Airway Management Planned: LMA  Additional Equipment:   Intra-op Plan:   Post-operative Plan: Extubation in OR  Informed Consent: I have reviewed the patients History and Physical, chart, labs and discussed the procedure including the risks, benefits and alternatives for the proposed anesthesia with the patient or authorized representative who has indicated his/her understanding and acceptance.   Dental advisory given  Plan Discussed with: CRNA, Anesthesiologist and Surgeon  Anesthesia Plan Comments:         Anesthesia Quick Evaluation  

## 2014-03-17 NOTE — Anesthesia Procedure Notes (Addendum)
Anesthesia Regional Block:  Popliteal block  Pre-Anesthetic Checklist: ,, timeout performed, Correct Patient, Correct Site, Correct Laterality, Correct Procedure, Correct Position, site marked, Risks and benefits discussed,  Surgical consent,  Pre-op evaluation,  At surgeon's request and post-op pain management  Laterality: Right and Lower  Prep: chloraprep       Needles:  Injection technique: Single-shot  Needle Type: Echogenic Needle     Needle Length: 9cm 9 cm Needle Gauge: 21 and 21 G    Additional Needles:  Procedures: ultrasound guided (picture in chart) Popliteal block Narrative:  Start time: 03/17/2014 8:34 AM End time: 03/17/2014 8:38 AM Injection made incrementally with aspirations every 5 mL.  Performed by: Personally  Anesthesiologist: Lorrene Reid, MD   Anesthesia Regional Block:  Adductor canal block  Pre-Anesthetic Checklist: ,, timeout performed, Correct Patient, Correct Site, Correct Laterality, Correct Procedure, Correct Position, site marked, Risks and benefits discussed,  Surgical consent,  Pre-op evaluation,  At surgeon's request and post-op pain management  Laterality: Right and Lower  Prep: chloraprep       Needles:  Injection technique: Single-shot  Needle Type: Echogenic Needle     Needle Length: 9cm 9 cm Needle Gauge: 21 and 21 G    Additional Needles:  Procedures: ultrasound guided (picture in chart) Adductor canal block Narrative:  Start time: 03/17/2014 8:43 AM End time: 03/17/2014 8:49 AM Injection made incrementally with aspirations every 5 mL.  Performed by: Personally  Anesthesiologist: Lorrene Reid, MD   Procedure Name: LMA Insertion Date/Time: 03/17/2014 9:10 AM Performed by: Alyse Kathan Pre-anesthesia Checklist: Patient identified, Emergency Drugs available, Suction available and Patient being monitored Patient Re-evaluated:Patient Re-evaluated prior to inductionOxygen Delivery Method: Circle System  Utilized Preoxygenation: Pre-oxygenation with 100% oxygen Intubation Type: IV induction Ventilation: Mask ventilation without difficulty LMA: LMA inserted LMA Size: 4.0 Number of attempts: 1 Airway Equipment and Method: bite block Placement Confirmation: positive ETCO2 Tube secured with: Tape Dental Injury: Teeth and Oropharynx as per pre-operative assessment

## 2014-03-17 NOTE — Discharge Instructions (Signed)
John Hewitt, MD °[facility] ° °Please read the following information regarding your care after surgery. ° °Medications  °You only need a prescription for the narcotic pain medicine (ex. oxycodone, Percocet, Norco).  All of the other medicines listed below are available over the counter. °X acetominophen (Tylenol) 650 mg every 4-6 hours as you need for minor pain °X oxycodone as prescribed for moderate to severe pain ° °Narcotic pain medicine (ex. oxycodone, Percocet, Vicodin) will cause constipation.  To prevent this problem, take the following medicines while you are taking any pain medicine. °X docusate sodium (Colace) 100 mg twice a day X senna (Senokot) 2 tablets twice a day ° °X To help prevent blood clots, take an aspirin (325 mg) once a day for a month after surgery.  You should also get up every hour while you are awake to move around.   ° °Weight Bearing °? Bear weight when you are able on your operated leg or foot. °? Bear weight only on the heel of your operated foot in the post-op shoe. °X Do not bear any weight on the operated leg or foot. ° °Cast / Splint / Dressing °X Keep your splint or cast clean and dry.  Don’t put anything (coat hanger, pencil, etc) down inside of it.  If it gets damp, use a hair dryer on the cool setting to dry it.  If it gets soaked, call the office to schedule an appointment for a cast change. °? Remove your dressing 3 days after surgery and cover the incisions with dry dressings.   ° °After your dressing, cast or splint is removed; you may shower, but do not soak or scrub the wound.  Allow the water to run over it, and then gently pat it dry. ° °Swelling °It is normal for you to have swelling where you had surgery.  To reduce swelling and pain, keep your toes above your nose for at least 3 days after surgery.  It may be necessary to keep your foot or leg elevated for several weeks.  If it hurts, it should be elevated. ° °Follow Up °Call my office at [phone]  when you are discharged from the hospital or surgery center to schedule an appointment to be seen two weeks after surgery. ° °Call my office at [phone] if you develop a fever >101.5° F, nausea, vomiting, bleeding from the surgical site or severe pain.   ° ° °Post Anesthesia Home Care Instructions ° °Activity: °Get plenty of rest for the remainder of the day. A responsible adult should stay with you for 24 hours following the procedure.  °For the next 24 hours, DO NOT: °-Drive a car °-Operate machinery °-Drink alcoholic beverages °-Take any medication unless instructed by your physician °-Make any legal decisions or sign important papers. ° °Meals: °Start with liquid foods such as gelatin or soup. Progress to regular foods as tolerated. Avoid greasy, spicy, heavy foods. If nausea and/or vomiting occur, drink only clear liquids until the nausea and/or vomiting subsides. Call your physician if vomiting continues. ° °Special Instructions/Symptoms: °Your throat may feel dry or sore from the anesthesia or the breathing tube placed in your throat during surgery. If this causes discomfort, gargle with warm salt water. The discomfort should disappear within 24 hours. ° ° ° °Regional Anesthesia Blocks ° °1. Numbness or the inability to move the "blocked" extremity may last from 3-48 hours after placement. The length of time depends on the medication injected and your individual response to the medication.   If the numbness is not going away after 48 hours, call your surgeon. ° °2. The extremity that is blocked will need to be protected until the numbness is gone and the  Strength has returned. Because you cannot feel it, you will need to take extra care to avoid injury. Because it may be weak, you may have difficulty moving it or using it. You may not know what position it is in without looking at it while the block is in effect. ° °3. For blocks in the legs and feet, returning to weight bearing and walking needs to be  done carefully. You will need to wait until the numbness is entirely gone and the strength has returned. You should be able to move your leg and foot normally before you try and bear weight or walk. You will need someone to be with you when you first try to ensure you do not fall and possibly risk injury. ° °4. Bruising and tenderness at the needle site are common side effects and will resolve in a few days. ° °5. Persistent numbness or new problems with movement should be communicated to the surgeon or the [facility] ([phone])/ [facility] ([phone]). °

## 2014-03-17 NOTE — Progress Notes (Signed)
Assisted Dr. Crews with right, ultrasound guided, popliteal block. Side rails up, monitors on throughout procedure. See vital signs in flow sheet. Tolerated Procedure well. 

## 2014-03-28 ENCOUNTER — Encounter (HOSPITAL_BASED_OUTPATIENT_CLINIC_OR_DEPARTMENT_OTHER): Payer: Self-pay | Admitting: Orthopedic Surgery

## 2014-03-30 DIAGNOSIS — Z4789 Encounter for other orthopedic aftercare: Secondary | ICD-10-CM | POA: Diagnosis not present

## 2014-04-01 DIAGNOSIS — M47817 Spondylosis without myelopathy or radiculopathy, lumbosacral region: Secondary | ICD-10-CM | POA: Diagnosis not present

## 2014-04-12 DIAGNOSIS — R972 Elevated prostate specific antigen [PSA]: Secondary | ICD-10-CM | POA: Diagnosis not present

## 2014-04-18 DIAGNOSIS — N401 Enlarged prostate with lower urinary tract symptoms: Secondary | ICD-10-CM | POA: Diagnosis not present

## 2014-04-18 DIAGNOSIS — N138 Other obstructive and reflux uropathy: Secondary | ICD-10-CM | POA: Diagnosis not present

## 2014-04-18 DIAGNOSIS — R972 Elevated prostate specific antigen [PSA]: Secondary | ICD-10-CM | POA: Diagnosis not present

## 2014-04-27 DIAGNOSIS — Z4789 Encounter for other orthopedic aftercare: Secondary | ICD-10-CM | POA: Diagnosis not present

## 2014-05-10 DIAGNOSIS — H34239 Retinal artery branch occlusion, unspecified eye: Secondary | ICD-10-CM | POA: Diagnosis not present

## 2014-05-10 DIAGNOSIS — H3410 Central retinal artery occlusion, unspecified eye: Secondary | ICD-10-CM | POA: Diagnosis not present

## 2014-06-01 DIAGNOSIS — Z4789 Encounter for other orthopedic aftercare: Secondary | ICD-10-CM | POA: Diagnosis not present

## 2014-06-01 DIAGNOSIS — Z981 Arthrodesis status: Secondary | ICD-10-CM | POA: Diagnosis not present

## 2014-06-13 DIAGNOSIS — M47817 Spondylosis without myelopathy or radiculopathy, lumbosacral region: Secondary | ICD-10-CM | POA: Diagnosis not present

## 2014-06-28 DIAGNOSIS — Z23 Encounter for immunization: Secondary | ICD-10-CM | POA: Diagnosis not present

## 2014-06-28 DIAGNOSIS — I10 Essential (primary) hypertension: Secondary | ICD-10-CM | POA: Diagnosis not present

## 2014-06-29 DIAGNOSIS — M129 Arthropathy, unspecified: Secondary | ICD-10-CM | POA: Diagnosis not present

## 2014-06-29 DIAGNOSIS — Z981 Arthrodesis status: Secondary | ICD-10-CM | POA: Diagnosis not present

## 2014-07-12 DIAGNOSIS — M47817 Spondylosis without myelopathy or radiculopathy, lumbosacral region: Secondary | ICD-10-CM | POA: Diagnosis not present

## 2014-07-13 DIAGNOSIS — M25519 Pain in unspecified shoulder: Secondary | ICD-10-CM | POA: Diagnosis not present

## 2014-07-13 DIAGNOSIS — M719 Bursopathy, unspecified: Secondary | ICD-10-CM | POA: Diagnosis not present

## 2014-07-13 DIAGNOSIS — M67919 Unspecified disorder of synovium and tendon, unspecified shoulder: Secondary | ICD-10-CM | POA: Diagnosis not present

## 2014-07-14 DIAGNOSIS — M25579 Pain in unspecified ankle and joints of unspecified foot: Secondary | ICD-10-CM | POA: Diagnosis not present

## 2014-07-18 DIAGNOSIS — M25579 Pain in unspecified ankle and joints of unspecified foot: Secondary | ICD-10-CM | POA: Diagnosis not present

## 2014-07-21 DIAGNOSIS — M25579 Pain in unspecified ankle and joints of unspecified foot: Secondary | ICD-10-CM | POA: Diagnosis not present

## 2014-07-25 DIAGNOSIS — M25579 Pain in unspecified ankle and joints of unspecified foot: Secondary | ICD-10-CM | POA: Diagnosis not present

## 2014-07-28 DIAGNOSIS — M25579 Pain in unspecified ankle and joints of unspecified foot: Secondary | ICD-10-CM | POA: Diagnosis not present

## 2014-08-01 DIAGNOSIS — M25579 Pain in unspecified ankle and joints of unspecified foot: Secondary | ICD-10-CM | POA: Diagnosis not present

## 2014-08-04 DIAGNOSIS — M25579 Pain in unspecified ankle and joints of unspecified foot: Secondary | ICD-10-CM | POA: Diagnosis not present

## 2014-08-08 DIAGNOSIS — M25579 Pain in unspecified ankle and joints of unspecified foot: Secondary | ICD-10-CM | POA: Diagnosis not present

## 2014-08-08 DIAGNOSIS — M25519 Pain in unspecified shoulder: Secondary | ICD-10-CM | POA: Diagnosis not present

## 2014-09-12 DIAGNOSIS — M25512 Pain in left shoulder: Secondary | ICD-10-CM | POA: Diagnosis not present

## 2014-09-12 DIAGNOSIS — M533 Sacrococcygeal disorders, not elsewhere classified: Secondary | ICD-10-CM | POA: Diagnosis not present

## 2014-09-13 DIAGNOSIS — M533 Sacrococcygeal disorders, not elsewhere classified: Secondary | ICD-10-CM | POA: Diagnosis not present

## 2014-09-26 DIAGNOSIS — M545 Low back pain: Secondary | ICD-10-CM | POA: Diagnosis not present

## 2014-09-26 DIAGNOSIS — M19071 Primary osteoarthritis, right ankle and foot: Secondary | ICD-10-CM | POA: Diagnosis not present

## 2014-11-09 DIAGNOSIS — M533 Sacrococcygeal disorders, not elsewhere classified: Secondary | ICD-10-CM | POA: Diagnosis not present

## 2015-01-23 DIAGNOSIS — N400 Enlarged prostate without lower urinary tract symptoms: Secondary | ICD-10-CM | POA: Diagnosis not present

## 2015-01-23 DIAGNOSIS — M5030 Other cervical disc degeneration, unspecified cervical region: Secondary | ICD-10-CM | POA: Diagnosis not present

## 2015-01-23 DIAGNOSIS — Z1389 Encounter for screening for other disorder: Secondary | ICD-10-CM | POA: Diagnosis not present

## 2015-01-23 DIAGNOSIS — M179 Osteoarthritis of knee, unspecified: Secondary | ICD-10-CM | POA: Diagnosis not present

## 2015-01-23 DIAGNOSIS — I10 Essential (primary) hypertension: Secondary | ICD-10-CM | POA: Diagnosis not present

## 2015-01-23 DIAGNOSIS — E780 Pure hypercholesterolemia: Secondary | ICD-10-CM | POA: Diagnosis not present

## 2015-01-23 DIAGNOSIS — M5137 Other intervertebral disc degeneration, lumbosacral region: Secondary | ICD-10-CM | POA: Diagnosis not present

## 2015-04-12 DIAGNOSIS — R972 Elevated prostate specific antigen [PSA]: Secondary | ICD-10-CM | POA: Diagnosis not present

## 2015-04-18 DIAGNOSIS — M25511 Pain in right shoulder: Secondary | ICD-10-CM | POA: Diagnosis not present

## 2015-04-18 DIAGNOSIS — M47816 Spondylosis without myelopathy or radiculopathy, lumbar region: Secondary | ICD-10-CM | POA: Diagnosis not present

## 2015-04-19 DIAGNOSIS — N401 Enlarged prostate with lower urinary tract symptoms: Secondary | ICD-10-CM | POA: Diagnosis not present

## 2015-04-19 DIAGNOSIS — N138 Other obstructive and reflux uropathy: Secondary | ICD-10-CM | POA: Diagnosis not present

## 2015-05-03 DIAGNOSIS — G5602 Carpal tunnel syndrome, left upper limb: Secondary | ICD-10-CM | POA: Diagnosis not present

## 2015-05-11 DIAGNOSIS — G5602 Carpal tunnel syndrome, left upper limb: Secondary | ICD-10-CM | POA: Diagnosis not present

## 2015-05-12 DIAGNOSIS — G5602 Carpal tunnel syndrome, left upper limb: Secondary | ICD-10-CM | POA: Diagnosis not present

## 2015-05-16 DIAGNOSIS — H3412 Central retinal artery occlusion, left eye: Secondary | ICD-10-CM | POA: Diagnosis not present

## 2015-05-16 DIAGNOSIS — H47013 Ischemic optic neuropathy, bilateral: Secondary | ICD-10-CM | POA: Diagnosis not present

## 2015-05-16 DIAGNOSIS — H34231 Retinal artery branch occlusion, right eye: Secondary | ICD-10-CM | POA: Diagnosis not present

## 2015-05-16 DIAGNOSIS — H4720 Unspecified optic atrophy: Secondary | ICD-10-CM | POA: Diagnosis not present

## 2015-05-26 DIAGNOSIS — Z961 Presence of intraocular lens: Secondary | ICD-10-CM | POA: Diagnosis not present

## 2015-05-26 DIAGNOSIS — H02834 Dermatochalasis of left upper eyelid: Secondary | ICD-10-CM | POA: Diagnosis not present

## 2015-05-26 DIAGNOSIS — H34233 Retinal artery branch occlusion, bilateral: Secondary | ICD-10-CM | POA: Diagnosis not present

## 2015-05-26 DIAGNOSIS — H4720 Unspecified optic atrophy: Secondary | ICD-10-CM | POA: Diagnosis not present

## 2015-05-26 DIAGNOSIS — H02831 Dermatochalasis of right upper eyelid: Secondary | ICD-10-CM | POA: Diagnosis not present

## 2015-07-27 DIAGNOSIS — I10 Essential (primary) hypertension: Secondary | ICD-10-CM | POA: Diagnosis not present

## 2016-02-01 DIAGNOSIS — E7800 Pure hypercholesterolemia, unspecified: Secondary | ICD-10-CM | POA: Diagnosis not present

## 2016-02-01 DIAGNOSIS — I10 Essential (primary) hypertension: Secondary | ICD-10-CM | POA: Diagnosis not present

## 2016-02-01 DIAGNOSIS — Z1389 Encounter for screening for other disorder: Secondary | ICD-10-CM | POA: Diagnosis not present

## 2016-02-01 DIAGNOSIS — Z0000 Encounter for general adult medical examination without abnormal findings: Secondary | ICD-10-CM | POA: Diagnosis not present

## 2016-02-27 DIAGNOSIS — M533 Sacrococcygeal disorders, not elsewhere classified: Secondary | ICD-10-CM | POA: Diagnosis not present

## 2016-02-27 DIAGNOSIS — M25511 Pain in right shoulder: Secondary | ICD-10-CM | POA: Diagnosis not present

## 2016-02-27 DIAGNOSIS — M47816 Spondylosis without myelopathy or radiculopathy, lumbar region: Secondary | ICD-10-CM | POA: Diagnosis not present

## 2016-03-19 DIAGNOSIS — M47816 Spondylosis without myelopathy or radiculopathy, lumbar region: Secondary | ICD-10-CM | POA: Diagnosis not present

## 2016-04-05 DIAGNOSIS — M545 Low back pain: Secondary | ICD-10-CM | POA: Diagnosis not present

## 2016-04-05 DIAGNOSIS — M25511 Pain in right shoulder: Secondary | ICD-10-CM | POA: Diagnosis not present

## 2016-06-05 DIAGNOSIS — H4720 Unspecified optic atrophy: Secondary | ICD-10-CM | POA: Diagnosis not present

## 2016-06-05 DIAGNOSIS — Z961 Presence of intraocular lens: Secondary | ICD-10-CM | POA: Diagnosis not present

## 2016-06-05 DIAGNOSIS — H34233 Retinal artery branch occlusion, bilateral: Secondary | ICD-10-CM | POA: Diagnosis not present

## 2016-06-05 DIAGNOSIS — H02834 Dermatochalasis of left upper eyelid: Secondary | ICD-10-CM | POA: Diagnosis not present

## 2016-06-05 DIAGNOSIS — H02831 Dermatochalasis of right upper eyelid: Secondary | ICD-10-CM | POA: Diagnosis not present

## 2016-06-13 DIAGNOSIS — M47816 Spondylosis without myelopathy or radiculopathy, lumbar region: Secondary | ICD-10-CM | POA: Diagnosis not present

## 2016-06-13 DIAGNOSIS — M533 Sacrococcygeal disorders, not elsewhere classified: Secondary | ICD-10-CM | POA: Diagnosis not present

## 2016-07-02 DIAGNOSIS — M47816 Spondylosis without myelopathy or radiculopathy, lumbar region: Secondary | ICD-10-CM | POA: Diagnosis not present

## 2016-07-17 DIAGNOSIS — M79671 Pain in right foot: Secondary | ICD-10-CM | POA: Diagnosis not present

## 2016-07-31 DIAGNOSIS — M25511 Pain in right shoulder: Secondary | ICD-10-CM | POA: Diagnosis not present

## 2016-07-31 DIAGNOSIS — M5416 Radiculopathy, lumbar region: Secondary | ICD-10-CM | POA: Diagnosis not present

## 2016-08-02 DIAGNOSIS — Z7189 Other specified counseling: Secondary | ICD-10-CM | POA: Diagnosis not present

## 2016-08-02 DIAGNOSIS — I10 Essential (primary) hypertension: Secondary | ICD-10-CM | POA: Diagnosis not present

## 2016-08-15 DIAGNOSIS — M5416 Radiculopathy, lumbar region: Secondary | ICD-10-CM | POA: Diagnosis not present

## 2016-09-03 DIAGNOSIS — M5416 Radiculopathy, lumbar region: Secondary | ICD-10-CM | POA: Diagnosis not present

## 2016-09-06 DIAGNOSIS — R3915 Urgency of urination: Secondary | ICD-10-CM | POA: Diagnosis not present

## 2016-09-06 DIAGNOSIS — R350 Frequency of micturition: Secondary | ICD-10-CM | POA: Diagnosis not present

## 2016-09-06 DIAGNOSIS — R972 Elevated prostate specific antigen [PSA]: Secondary | ICD-10-CM | POA: Diagnosis not present

## 2016-09-06 DIAGNOSIS — N401 Enlarged prostate with lower urinary tract symptoms: Secondary | ICD-10-CM | POA: Diagnosis not present

## 2016-09-13 DIAGNOSIS — M791 Myalgia: Secondary | ICD-10-CM | POA: Diagnosis not present

## 2016-09-13 DIAGNOSIS — M25511 Pain in right shoulder: Secondary | ICD-10-CM | POA: Diagnosis not present

## 2016-11-05 DIAGNOSIS — Z23 Encounter for immunization: Secondary | ICD-10-CM | POA: Diagnosis not present

## 2016-11-25 DIAGNOSIS — M24511 Contracture, right shoulder: Secondary | ICD-10-CM | POA: Diagnosis not present

## 2016-11-25 DIAGNOSIS — M545 Low back pain: Secondary | ICD-10-CM | POA: Diagnosis not present

## 2016-12-09 DIAGNOSIS — J209 Acute bronchitis, unspecified: Secondary | ICD-10-CM | POA: Diagnosis not present

## 2017-02-06 DIAGNOSIS — M5416 Radiculopathy, lumbar region: Secondary | ICD-10-CM | POA: Diagnosis not present

## 2017-02-07 DIAGNOSIS — Z0000 Encounter for general adult medical examination without abnormal findings: Secondary | ICD-10-CM | POA: Diagnosis not present

## 2017-02-07 DIAGNOSIS — N400 Enlarged prostate without lower urinary tract symptoms: Secondary | ICD-10-CM | POA: Diagnosis not present

## 2017-02-07 DIAGNOSIS — I10 Essential (primary) hypertension: Secondary | ICD-10-CM | POA: Diagnosis not present

## 2017-02-07 DIAGNOSIS — E7800 Pure hypercholesterolemia, unspecified: Secondary | ICD-10-CM | POA: Diagnosis not present

## 2017-02-07 DIAGNOSIS — Z1389 Encounter for screening for other disorder: Secondary | ICD-10-CM | POA: Diagnosis not present

## 2017-02-12 DIAGNOSIS — M47816 Spondylosis without myelopathy or radiculopathy, lumbar region: Secondary | ICD-10-CM | POA: Diagnosis not present

## 2017-02-12 DIAGNOSIS — M25511 Pain in right shoulder: Secondary | ICD-10-CM | POA: Diagnosis not present

## 2017-02-25 ENCOUNTER — Encounter (HOSPITAL_COMMUNITY): Payer: Self-pay | Admitting: Emergency Medicine

## 2017-02-25 ENCOUNTER — Emergency Department (HOSPITAL_COMMUNITY)
Admission: EM | Admit: 2017-02-25 | Discharge: 2017-02-25 | Disposition: A | Discharge status: Home / Self Care | Payer: Medicare Other | Attending: Emergency Medicine | Admitting: Emergency Medicine

## 2017-02-25 DIAGNOSIS — Z96653 Presence of artificial knee joint, bilateral: Secondary | ICD-10-CM | POA: Diagnosis not present

## 2017-02-25 DIAGNOSIS — N400 Enlarged prostate without lower urinary tract symptoms: Secondary | ICD-10-CM

## 2017-02-25 DIAGNOSIS — N401 Enlarged prostate with lower urinary tract symptoms: Secondary | ICD-10-CM | POA: Insufficient documentation

## 2017-02-25 DIAGNOSIS — Z7982 Long term (current) use of aspirin: Secondary | ICD-10-CM | POA: Diagnosis not present

## 2017-02-25 DIAGNOSIS — R339 Retention of urine, unspecified: Secondary | ICD-10-CM | POA: Diagnosis present

## 2017-02-25 DIAGNOSIS — Z87891 Personal history of nicotine dependence: Secondary | ICD-10-CM | POA: Diagnosis not present

## 2017-02-25 DIAGNOSIS — R338 Other retention of urine: Secondary | ICD-10-CM

## 2017-02-25 DIAGNOSIS — I10 Essential (primary) hypertension: Secondary | ICD-10-CM | POA: Insufficient documentation

## 2017-02-25 LAB — URINALYSIS, ROUTINE W REFLEX MICROSCOPIC
Bacteria, UA: NONE SEEN
Bilirubin Urine: NEGATIVE
GLUCOSE, UA: NEGATIVE mg/dL
Ketones, ur: NEGATIVE mg/dL
Leukocytes, UA: NEGATIVE
Nitrite: NEGATIVE
Protein, ur: NEGATIVE mg/dL
Specific Gravity, Urine: 1.008 (ref 1.005–1.030)
Squamous Epithelial / LPF: NONE SEEN
pH: 5 (ref 5.0–8.0)

## 2017-02-25 NOTE — ED Triage Notes (Signed)
Pt c/o not being able to urinate since midnight; pt had this happen about 8 years ago and had to have a catheter; pt has BPH; Over 414mL in bladder

## 2017-02-25 NOTE — ED Notes (Signed)
Bed: WA04 Expected date:  Expected time:  Means of arrival:  Comments: 

## 2017-02-25 NOTE — ED Notes (Signed)
Leg bag applied and education provided

## 2017-02-25 NOTE — ED Provider Notes (Signed)
[facility] DEPT Provider Note   CSN: [id number] Arrival date & time: 02/25/17  0540     History   Chief Complaint Chief Complaint  Patient presents with  . Urinary Retention    HPI Kenneth Fleming is a 83 y.o. male.  HPI Pt comes in with urinary retention. Pt has BPH. He reports that he woke up in the middle of the night to urinate, dribbled just a little bit - and went back to bed. He woke up again at 5:30 am with more discomfort, and at that time he still couldn't urinate, so he came to the ER. No pain with urination. Pt is not on any new meds  Past Medical History:  Diagnosis Date  . Arthritis   . BPH (benign prostatic hypertrophy)   . Hypertension    sees Dr. Lavone Orn [phone]  . Retinal vascular occlusion of right eye    hx of  . Wears dentures    bottom  . Wears glasses   . Wears hearing aid    both ears    Patient Active Problem List   Diagnosis Date Noted  . 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 ANKLE FUSION Right 03/17/2014   Procedure: RIGHT TALONAVICULAR ARTHRODESIS  ;  Surgeon: Wylene Simmer, MD;  Location: [facility];  Service: Orthopedics;  Laterality: Right;  . COLONOSCOPY    . EYE SURGERY     bilaterally  . [place]   left  . GASTROC RECESSION EXTREMITY Right 03/17/2014   Procedure: RIGHT GASTROC RECESSION ;  Surgeon: Wylene Simmer, MD;  Location: [place] [facility];  Service: Orthopedics;  Laterality: Right;  . HEEL SPUR SURGERY     rt  . HERNIA REPAIR     bilateral inguinal, and umbilical  . TONSILLECTOMY    . TOTAL KNEE ARTHROPLASTY  1992   right knee  . TOTAL KNEE ARTHROPLASTY  12/22/2012   Procedure: TOTAL KNEE ARTHROPLASTY;  Surgeon: Hessie Dibble, MD;  Location: [place];  Service: Orthopedics;  Laterality: Left;  Marland Kitchen VASECTOMY         Home Medications    Prior to Admission medications   Medication Sig Start Date End Date Taking?  Authorizing Provider  acetaminophen (TYLENOL) 325 MG tablet Take 650 mg by mouth 2 (two) times daily as needed. For pain    Historical Provider, MD  amLODipine-benazepril (LOTREL) 5-10 MG per capsule Take 1 capsule by mouth daily.    Historical Provider, MD  aspirin EC 325 MG tablet Take 1 tablet (325 mg total) by mouth daily. 03/17/14   Wylene Simmer, MD  azelastine (ASTELIN) 137 MCG/SPRAY nasal spray Place 2 sprays into the nose daily. Use in each nostril as directed    Historical Provider, MD  finasteride (PROSCAR) 5 MG tablet Take 5 mg by mouth daily.    Historical Provider, MD  oxyCODONE (ROXICODONE) 5 MG immediate release tablet Take 1 tablet (5 mg total) by mouth every 6 (six) hours as needed for moderate pain or severe pain. 03/17/14   Wylene Simmer, MD  pravastatin (PRAVACHOL) 40 MG tablet Take 40 mg by mouth daily.    Historical Provider, MD  Tamsulosin HCl (FLOMAX) 0.4 MG CAPS Take 0.4 mg by mouth daily.    Historical Provider, MD    Family History No family history on file.  Social History Social History  Substance Use Topics  .  Smoking status: Former Smoker    Quit date: 03/11/1971  . Smokeless tobacco: Never Used     Comment: stopped smoking "1975"  . Alcohol use Yes     Comment: "occa"     Allergies   Patient has no known allergies.   Review of Systems Review of Systems  Constitutional: Positive for activity change.  Gastrointestinal: Positive for abdominal pain.  Genitourinary: Positive for difficulty urinating. Negative for dysuria.  Allergic/Immunologic: Negative for immunocompromised state.     Physical Exam Updated Vital Signs BP (!) 143/81 (BP Location: Left Arm)   Pulse 64   Temp 97.5 F (36.4 C) (Oral)   SpO2 94%   Physical Exam  Constitutional: He is oriented to person, place, and time. He appears well-developed.  HENT:  Head: Atraumatic.  Neck: Neck supple.  Cardiovascular: Normal rate.   Pulmonary/Chest: Effort normal.  Abdominal: Soft. There is  no tenderness.  Genitourinary:  Genitourinary Comments: Foley catheter has 400 cc of urine  Neurological: He is alert and oriented to person, place, and time.  Skin: Skin is warm.  Nursing note and vitals reviewed.    ED Treatments / Results  Labs (all labs ordered are listed, but only abnormal results are displayed) Labs Reviewed  URINALYSIS, ROUTINE W REFLEX MICROSCOPIC - Abnormal; Notable for the following:       Result Value   Color, Urine STRAW (*)    Hgb urine dipstick MODERATE (*)    All other components within normal limits  URINE CULTURE    EKG  EKG Interpretation None       Radiology No results found.  Procedures Procedures (including critical care time)  Medications Ordered in ED Medications - No data to display   Initial Impression / Assessment and Plan / ED Course  I have reviewed the triage vital signs and the nursing notes.  Pertinent labs & imaging results that were available during my care of the patient were reviewed by me and considered in my medical decision making (see chart for details).     Pt comes in with urinary retention. He has 400cc of urine in the foley. Pt has BPH. No new meds. UA is clean. Pt allegedly has one of the largest prostate that the Urologist has seen, so I suspect this is related to BPH. PT to be discharged with the foley and Urology f/u.  Final Clinical Impressions(s) / ED Diagnoses   Final diagnoses:  Acute urinary retention  Enlarged prostate    New Prescriptions New Prescriptions   No medications on file     Varney Biles, MD 02/25/17 [phone]

## 2017-02-25 NOTE — Discharge Instructions (Signed)
Call Urologist for an appointment within the next 5-7 days. Keep the leg bag on at all times. Return to the ER if you have abdominal pain, or if the urine stops coming out. DO NOT PULL THE FOLEY CATHETER OUT YOURSELF.

## 2017-02-26 LAB — URINE CULTURE: CULTURE: NO GROWTH

## 2017-03-04 DIAGNOSIS — R338 Other retention of urine: Secondary | ICD-10-CM | POA: Diagnosis not present

## 2017-03-06 DIAGNOSIS — R338 Other retention of urine: Secondary | ICD-10-CM | POA: Diagnosis not present

## 2017-03-06 DIAGNOSIS — M47816 Spondylosis without myelopathy or radiculopathy, lumbar region: Secondary | ICD-10-CM | POA: Diagnosis not present

## 2017-04-02 DIAGNOSIS — M533 Sacrococcygeal disorders, not elsewhere classified: Secondary | ICD-10-CM | POA: Diagnosis not present

## 2017-04-02 DIAGNOSIS — M25511 Pain in right shoulder: Secondary | ICD-10-CM | POA: Diagnosis not present

## 2017-04-03 DIAGNOSIS — M533 Sacrococcygeal disorders, not elsewhere classified: Secondary | ICD-10-CM | POA: Diagnosis not present

## 2017-06-10 DIAGNOSIS — H34233 Retinal artery branch occlusion, bilateral: Secondary | ICD-10-CM | POA: Diagnosis not present

## 2017-06-10 DIAGNOSIS — H02834 Dermatochalasis of left upper eyelid: Secondary | ICD-10-CM | POA: Diagnosis not present

## 2017-06-10 DIAGNOSIS — H4720 Unspecified optic atrophy: Secondary | ICD-10-CM | POA: Diagnosis not present

## 2017-06-10 DIAGNOSIS — Z961 Presence of intraocular lens: Secondary | ICD-10-CM | POA: Diagnosis not present

## 2017-06-10 DIAGNOSIS — H47013 Ischemic optic neuropathy, bilateral: Secondary | ICD-10-CM | POA: Diagnosis not present

## 2017-06-10 DIAGNOSIS — H02831 Dermatochalasis of right upper eyelid: Secondary | ICD-10-CM | POA: Diagnosis not present

## 2017-06-16 DIAGNOSIS — H02834 Dermatochalasis of left upper eyelid: Secondary | ICD-10-CM | POA: Diagnosis not present

## 2017-06-16 DIAGNOSIS — H02831 Dermatochalasis of right upper eyelid: Secondary | ICD-10-CM | POA: Diagnosis not present

## 2017-06-17 DIAGNOSIS — M533 Sacrococcygeal disorders, not elsewhere classified: Secondary | ICD-10-CM | POA: Diagnosis not present

## 2017-07-09 DIAGNOSIS — M25511 Pain in right shoulder: Secondary | ICD-10-CM | POA: Diagnosis not present

## 2017-07-09 DIAGNOSIS — M47816 Spondylosis without myelopathy or radiculopathy, lumbar region: Secondary | ICD-10-CM | POA: Diagnosis not present

## 2017-09-01 DIAGNOSIS — M545 Low back pain: Secondary | ICD-10-CM | POA: Diagnosis not present

## 2017-09-01 DIAGNOSIS — I10 Essential (primary) hypertension: Secondary | ICD-10-CM | POA: Diagnosis not present

## 2017-09-01 DIAGNOSIS — M25561 Pain in right knee: Secondary | ICD-10-CM | POA: Diagnosis not present

## 2017-09-01 DIAGNOSIS — N401 Enlarged prostate with lower urinary tract symptoms: Secondary | ICD-10-CM | POA: Diagnosis not present

## 2017-09-01 DIAGNOSIS — M5416 Radiculopathy, lumbar region: Secondary | ICD-10-CM | POA: Diagnosis not present

## 2017-09-01 DIAGNOSIS — H903 Sensorineural hearing loss, bilateral: Secondary | ICD-10-CM | POA: Diagnosis not present

## 2017-09-01 DIAGNOSIS — M159 Polyosteoarthritis, unspecified: Secondary | ICD-10-CM | POA: Diagnosis not present

## 2017-09-01 DIAGNOSIS — E785 Hyperlipidemia, unspecified: Secondary | ICD-10-CM | POA: Diagnosis not present

## 2017-09-08 DIAGNOSIS — G8929 Other chronic pain: Secondary | ICD-10-CM | POA: Diagnosis not present

## 2017-09-08 DIAGNOSIS — J309 Allergic rhinitis, unspecified: Secondary | ICD-10-CM | POA: Diagnosis not present

## 2017-09-08 DIAGNOSIS — M25561 Pain in right knee: Secondary | ICD-10-CM | POA: Diagnosis not present

## 2017-09-08 DIAGNOSIS — M25569 Pain in unspecified knee: Secondary | ICD-10-CM | POA: Diagnosis not present

## 2017-09-08 DIAGNOSIS — H6123 Impacted cerumen, bilateral: Secondary | ICD-10-CM | POA: Diagnosis not present

## 2017-09-08 DIAGNOSIS — N401 Enlarged prostate with lower urinary tract symptoms: Secondary | ICD-10-CM | POA: Diagnosis not present

## 2017-09-08 DIAGNOSIS — I10 Essential (primary) hypertension: Secondary | ICD-10-CM | POA: Diagnosis not present

## 2017-09-08 DIAGNOSIS — M159 Polyosteoarthritis, unspecified: Secondary | ICD-10-CM | POA: Diagnosis not present

## 2017-09-12 DIAGNOSIS — Z79899 Other long term (current) drug therapy: Secondary | ICD-10-CM | POA: Diagnosis not present

## 2017-09-22 DIAGNOSIS — Z79899 Other long term (current) drug therapy: Secondary | ICD-10-CM | POA: Diagnosis not present

## 2017-09-29 DIAGNOSIS — E875 Hyperkalemia: Secondary | ICD-10-CM | POA: Diagnosis not present

## 2017-09-29 DIAGNOSIS — D519 Vitamin B12 deficiency anemia, unspecified: Secondary | ICD-10-CM | POA: Diagnosis not present

## 2017-09-29 DIAGNOSIS — M25561 Pain in right knee: Secondary | ICD-10-CM | POA: Diagnosis not present

## 2017-09-29 DIAGNOSIS — M159 Polyosteoarthritis, unspecified: Secondary | ICD-10-CM | POA: Diagnosis not present

## 2017-09-29 DIAGNOSIS — J309 Allergic rhinitis, unspecified: Secondary | ICD-10-CM | POA: Diagnosis not present

## 2017-09-29 DIAGNOSIS — I10 Essential (primary) hypertension: Secondary | ICD-10-CM | POA: Diagnosis not present

## 2017-09-29 DIAGNOSIS — N182 Chronic kidney disease, stage 2 (mild): Secondary | ICD-10-CM | POA: Diagnosis not present

## 2017-09-29 DIAGNOSIS — M5416 Radiculopathy, lumbar region: Secondary | ICD-10-CM | POA: Diagnosis not present

## 2017-10-03 DIAGNOSIS — M7918 Myalgia, other site: Secondary | ICD-10-CM | POA: Diagnosis not present

## 2017-10-03 DIAGNOSIS — M47817 Spondylosis without myelopathy or radiculopathy, lumbosacral region: Secondary | ICD-10-CM | POA: Diagnosis not present

## 2017-10-03 DIAGNOSIS — M19011 Primary osteoarthritis, right shoulder: Secondary | ICD-10-CM | POA: Diagnosis not present

## 2017-10-03 DIAGNOSIS — M4316 Spondylolisthesis, lumbar region: Secondary | ICD-10-CM | POA: Diagnosis not present

## 2017-10-07 DIAGNOSIS — Z23 Encounter for immunization: Secondary | ICD-10-CM | POA: Diagnosis not present

## 2017-10-20 DIAGNOSIS — E875 Hyperkalemia: Secondary | ICD-10-CM | POA: Diagnosis not present

## 2017-10-20 DIAGNOSIS — M25511 Pain in right shoulder: Secondary | ICD-10-CM | POA: Diagnosis not present

## 2017-10-20 DIAGNOSIS — G8929 Other chronic pain: Secondary | ICD-10-CM | POA: Diagnosis not present

## 2017-10-20 DIAGNOSIS — M5416 Radiculopathy, lumbar region: Secondary | ICD-10-CM | POA: Diagnosis not present

## 2017-10-20 DIAGNOSIS — I10 Essential (primary) hypertension: Secondary | ICD-10-CM | POA: Diagnosis not present

## 2017-10-20 DIAGNOSIS — M545 Low back pain: Secondary | ICD-10-CM | POA: Diagnosis not present

## 2017-10-20 DIAGNOSIS — M159 Polyosteoarthritis, unspecified: Secondary | ICD-10-CM | POA: Diagnosis not present

## 2017-10-20 DIAGNOSIS — N182 Chronic kidney disease, stage 2 (mild): Secondary | ICD-10-CM | POA: Diagnosis not present

## 2017-11-13 DIAGNOSIS — M47817 Spondylosis without myelopathy or radiculopathy, lumbosacral region: Secondary | ICD-10-CM | POA: Diagnosis not present

## 2017-11-13 DIAGNOSIS — M7918 Myalgia, other site: Secondary | ICD-10-CM | POA: Diagnosis not present

## 2017-11-18 DIAGNOSIS — M4307 Spondylolysis, lumbosacral region: Secondary | ICD-10-CM | POA: Diagnosis not present

## 2017-11-18 DIAGNOSIS — S01301A Unspecified open wound of right ear, initial encounter: Secondary | ICD-10-CM | POA: Diagnosis not present

## 2017-11-18 DIAGNOSIS — I10 Essential (primary) hypertension: Secondary | ICD-10-CM | POA: Diagnosis not present

## 2017-11-18 DIAGNOSIS — M159 Polyosteoarthritis, unspecified: Secondary | ICD-10-CM | POA: Diagnosis not present

## 2017-11-18 DIAGNOSIS — G8929 Other chronic pain: Secondary | ICD-10-CM | POA: Diagnosis not present

## 2017-12-08 DIAGNOSIS — M25511 Pain in right shoulder: Secondary | ICD-10-CM | POA: Diagnosis not present

## 2017-12-08 DIAGNOSIS — E875 Hyperkalemia: Secondary | ICD-10-CM | POA: Diagnosis not present

## 2017-12-08 DIAGNOSIS — N401 Enlarged prostate with lower urinary tract symptoms: Secondary | ICD-10-CM | POA: Diagnosis not present

## 2017-12-08 DIAGNOSIS — M4307 Spondylolysis, lumbosacral region: Secondary | ICD-10-CM | POA: Diagnosis not present

## 2017-12-08 DIAGNOSIS — H61039 Chondritis of external ear, unspecified ear: Secondary | ICD-10-CM | POA: Diagnosis not present

## 2017-12-08 DIAGNOSIS — I10 Essential (primary) hypertension: Secondary | ICD-10-CM | POA: Diagnosis not present

## 2017-12-08 DIAGNOSIS — M545 Low back pain: Secondary | ICD-10-CM | POA: Diagnosis not present

## 2017-12-08 DIAGNOSIS — Z79899 Other long term (current) drug therapy: Secondary | ICD-10-CM | POA: Diagnosis not present

## 2017-12-08 DIAGNOSIS — M5416 Radiculopathy, lumbar region: Secondary | ICD-10-CM | POA: Diagnosis not present

## 2017-12-22 DIAGNOSIS — M4307 Spondylolysis, lumbosacral region: Secondary | ICD-10-CM | POA: Diagnosis not present

## 2017-12-22 DIAGNOSIS — S01301A Unspecified open wound of right ear, initial encounter: Secondary | ICD-10-CM | POA: Diagnosis not present

## 2017-12-22 DIAGNOSIS — G8929 Other chronic pain: Secondary | ICD-10-CM | POA: Diagnosis not present

## 2017-12-22 DIAGNOSIS — I10 Essential (primary) hypertension: Secondary | ICD-10-CM | POA: Diagnosis not present

## 2018-01-14 DIAGNOSIS — M25569 Pain in unspecified knee: Secondary | ICD-10-CM | POA: Diagnosis not present

## 2018-01-14 DIAGNOSIS — R531 Weakness: Secondary | ICD-10-CM | POA: Diagnosis not present

## 2018-01-14 DIAGNOSIS — M159 Polyosteoarthritis, unspecified: Secondary | ICD-10-CM | POA: Diagnosis not present

## 2018-01-14 DIAGNOSIS — R269 Unspecified abnormalities of gait and mobility: Secondary | ICD-10-CM | POA: Diagnosis not present

## 2018-01-14 DIAGNOSIS — M5416 Radiculopathy, lumbar region: Secondary | ICD-10-CM | POA: Diagnosis not present

## 2018-01-14 DIAGNOSIS — M545 Low back pain: Secondary | ICD-10-CM | POA: Diagnosis not present

## 2018-01-14 DIAGNOSIS — G8929 Other chronic pain: Secondary | ICD-10-CM | POA: Diagnosis not present

## 2018-01-14 DIAGNOSIS — I10 Essential (primary) hypertension: Secondary | ICD-10-CM | POA: Diagnosis not present

## 2018-01-22 DIAGNOSIS — L814 Other melanin hyperpigmentation: Secondary | ICD-10-CM | POA: Diagnosis not present

## 2018-01-22 DIAGNOSIS — H61032 Chondritis of left external ear: Secondary | ICD-10-CM | POA: Diagnosis not present

## 2018-01-22 DIAGNOSIS — L538 Other specified erythematous conditions: Secondary | ICD-10-CM | POA: Diagnosis not present

## 2018-01-22 DIAGNOSIS — C44319 Basal cell carcinoma of skin of other parts of face: Secondary | ICD-10-CM | POA: Diagnosis not present

## 2018-01-22 DIAGNOSIS — L821 Other seborrheic keratosis: Secondary | ICD-10-CM | POA: Diagnosis not present

## 2018-01-22 DIAGNOSIS — L820 Inflamed seborrheic keratosis: Secondary | ICD-10-CM | POA: Diagnosis not present

## 2018-01-22 DIAGNOSIS — D485 Neoplasm of uncertain behavior of skin: Secondary | ICD-10-CM | POA: Diagnosis not present

## 2018-01-22 DIAGNOSIS — L298 Other pruritus: Secondary | ICD-10-CM | POA: Diagnosis not present

## 2018-01-22 DIAGNOSIS — L570 Actinic keratosis: Secondary | ICD-10-CM | POA: Diagnosis not present

## 2018-01-29 DIAGNOSIS — R2681 Unsteadiness on feet: Secondary | ICD-10-CM | POA: Diagnosis not present

## 2018-01-29 DIAGNOSIS — M6281 Muscle weakness (generalized): Secondary | ICD-10-CM | POA: Diagnosis not present

## 2018-01-29 DIAGNOSIS — M545 Low back pain: Secondary | ICD-10-CM | POA: Diagnosis not present

## 2018-02-02 DIAGNOSIS — L821 Other seborrheic keratosis: Secondary | ICD-10-CM | POA: Diagnosis not present

## 2018-02-02 DIAGNOSIS — Z66 Do not resuscitate: Secondary | ICD-10-CM | POA: Diagnosis not present

## 2018-02-02 DIAGNOSIS — H61009 Unspecified perichondritis of external ear, unspecified ear: Secondary | ICD-10-CM | POA: Diagnosis not present

## 2018-02-02 DIAGNOSIS — M545 Low back pain: Secondary | ICD-10-CM | POA: Diagnosis not present

## 2018-02-02 DIAGNOSIS — I10 Essential (primary) hypertension: Secondary | ICD-10-CM | POA: Diagnosis not present

## 2018-02-02 DIAGNOSIS — L570 Actinic keratosis: Secondary | ICD-10-CM | POA: Diagnosis not present

## 2018-02-02 DIAGNOSIS — G8929 Other chronic pain: Secondary | ICD-10-CM | POA: Diagnosis not present

## 2018-02-02 DIAGNOSIS — C44319 Basal cell carcinoma of skin of other parts of face: Secondary | ICD-10-CM | POA: Diagnosis not present

## 2018-02-03 DIAGNOSIS — M6281 Muscle weakness (generalized): Secondary | ICD-10-CM | POA: Diagnosis not present

## 2018-02-03 DIAGNOSIS — R2681 Unsteadiness on feet: Secondary | ICD-10-CM | POA: Diagnosis not present

## 2018-02-03 DIAGNOSIS — M545 Low back pain: Secondary | ICD-10-CM | POA: Diagnosis not present

## 2018-02-04 DIAGNOSIS — R2681 Unsteadiness on feet: Secondary | ICD-10-CM | POA: Diagnosis not present

## 2018-02-04 DIAGNOSIS — M6281 Muscle weakness (generalized): Secondary | ICD-10-CM | POA: Diagnosis not present

## 2018-02-04 DIAGNOSIS — M545 Low back pain: Secondary | ICD-10-CM | POA: Diagnosis not present

## 2018-02-05 DIAGNOSIS — R2681 Unsteadiness on feet: Secondary | ICD-10-CM | POA: Diagnosis not present

## 2018-02-05 DIAGNOSIS — M6281 Muscle weakness (generalized): Secondary | ICD-10-CM | POA: Diagnosis not present

## 2018-02-05 DIAGNOSIS — M545 Low back pain: Secondary | ICD-10-CM | POA: Diagnosis not present

## 2018-02-06 DIAGNOSIS — M6281 Muscle weakness (generalized): Secondary | ICD-10-CM | POA: Diagnosis not present

## 2018-02-06 DIAGNOSIS — R2681 Unsteadiness on feet: Secondary | ICD-10-CM | POA: Diagnosis not present

## 2018-02-06 DIAGNOSIS — M545 Low back pain: Secondary | ICD-10-CM | POA: Diagnosis not present

## 2018-02-10 DIAGNOSIS — M6281 Muscle weakness (generalized): Secondary | ICD-10-CM | POA: Diagnosis not present

## 2018-02-10 DIAGNOSIS — R2681 Unsteadiness on feet: Secondary | ICD-10-CM | POA: Diagnosis not present

## 2018-02-10 DIAGNOSIS — M545 Low back pain: Secondary | ICD-10-CM | POA: Diagnosis not present

## 2018-02-11 DIAGNOSIS — R2681 Unsteadiness on feet: Secondary | ICD-10-CM | POA: Diagnosis not present

## 2018-02-11 DIAGNOSIS — M545 Low back pain: Secondary | ICD-10-CM | POA: Diagnosis not present

## 2018-02-11 DIAGNOSIS — M6281 Muscle weakness (generalized): Secondary | ICD-10-CM | POA: Diagnosis not present

## 2018-02-12 DIAGNOSIS — M6281 Muscle weakness (generalized): Secondary | ICD-10-CM | POA: Diagnosis not present

## 2018-02-12 DIAGNOSIS — M545 Low back pain: Secondary | ICD-10-CM | POA: Diagnosis not present

## 2018-02-12 DIAGNOSIS — R2681 Unsteadiness on feet: Secondary | ICD-10-CM | POA: Diagnosis not present

## 2018-02-13 DIAGNOSIS — M6281 Muscle weakness (generalized): Secondary | ICD-10-CM | POA: Diagnosis not present

## 2018-02-13 DIAGNOSIS — R2681 Unsteadiness on feet: Secondary | ICD-10-CM | POA: Diagnosis not present

## 2018-02-13 DIAGNOSIS — M545 Low back pain: Secondary | ICD-10-CM | POA: Diagnosis not present

## 2018-02-16 DIAGNOSIS — M6281 Muscle weakness (generalized): Secondary | ICD-10-CM | POA: Diagnosis not present

## 2018-02-16 DIAGNOSIS — R2681 Unsteadiness on feet: Secondary | ICD-10-CM | POA: Diagnosis not present

## 2018-02-16 DIAGNOSIS — M545 Low back pain: Secondary | ICD-10-CM | POA: Diagnosis not present

## 2018-02-18 DIAGNOSIS — R2681 Unsteadiness on feet: Secondary | ICD-10-CM | POA: Diagnosis not present

## 2018-02-18 DIAGNOSIS — M6281 Muscle weakness (generalized): Secondary | ICD-10-CM | POA: Diagnosis not present

## 2018-02-18 DIAGNOSIS — M545 Low back pain: Secondary | ICD-10-CM | POA: Diagnosis not present

## 2018-02-20 DIAGNOSIS — M545 Low back pain: Secondary | ICD-10-CM | POA: Diagnosis not present

## 2018-02-20 DIAGNOSIS — M6281 Muscle weakness (generalized): Secondary | ICD-10-CM | POA: Diagnosis not present

## 2018-02-20 DIAGNOSIS — R2681 Unsteadiness on feet: Secondary | ICD-10-CM | POA: Diagnosis not present

## 2018-02-23 DIAGNOSIS — M545 Low back pain: Secondary | ICD-10-CM | POA: Diagnosis not present

## 2018-02-23 DIAGNOSIS — R2681 Unsteadiness on feet: Secondary | ICD-10-CM | POA: Diagnosis not present

## 2018-02-23 DIAGNOSIS — M6281 Muscle weakness (generalized): Secondary | ICD-10-CM | POA: Diagnosis not present

## 2018-02-24 DIAGNOSIS — R2681 Unsteadiness on feet: Secondary | ICD-10-CM | POA: Diagnosis not present

## 2018-02-24 DIAGNOSIS — M545 Low back pain: Secondary | ICD-10-CM | POA: Diagnosis not present

## 2018-02-24 DIAGNOSIS — M6281 Muscle weakness (generalized): Secondary | ICD-10-CM | POA: Diagnosis not present

## 2018-02-26 DIAGNOSIS — M6281 Muscle weakness (generalized): Secondary | ICD-10-CM | POA: Diagnosis not present

## 2018-02-26 DIAGNOSIS — M545 Low back pain: Secondary | ICD-10-CM | POA: Diagnosis not present

## 2018-02-26 DIAGNOSIS — R2681 Unsteadiness on feet: Secondary | ICD-10-CM | POA: Diagnosis not present

## 2018-02-27 DIAGNOSIS — R2681 Unsteadiness on feet: Secondary | ICD-10-CM | POA: Diagnosis not present

## 2018-02-27 DIAGNOSIS — M6281 Muscle weakness (generalized): Secondary | ICD-10-CM | POA: Diagnosis not present

## 2018-02-27 DIAGNOSIS — M545 Low back pain: Secondary | ICD-10-CM | POA: Diagnosis not present

## 2018-03-02 DIAGNOSIS — M545 Low back pain: Secondary | ICD-10-CM | POA: Diagnosis not present

## 2018-03-02 DIAGNOSIS — R2681 Unsteadiness on feet: Secondary | ICD-10-CM | POA: Diagnosis not present

## 2018-03-02 DIAGNOSIS — M6281 Muscle weakness (generalized): Secondary | ICD-10-CM | POA: Diagnosis not present

## 2018-03-03 DIAGNOSIS — R2681 Unsteadiness on feet: Secondary | ICD-10-CM | POA: Diagnosis not present

## 2018-03-03 DIAGNOSIS — M6281 Muscle weakness (generalized): Secondary | ICD-10-CM | POA: Diagnosis not present

## 2018-03-03 DIAGNOSIS — M545 Low back pain: Secondary | ICD-10-CM | POA: Diagnosis not present

## 2018-03-04 DIAGNOSIS — M545 Low back pain: Secondary | ICD-10-CM | POA: Diagnosis not present

## 2018-03-04 DIAGNOSIS — R2681 Unsteadiness on feet: Secondary | ICD-10-CM | POA: Diagnosis not present

## 2018-03-04 DIAGNOSIS — M6281 Muscle weakness (generalized): Secondary | ICD-10-CM | POA: Diagnosis not present

## 2018-03-05 DIAGNOSIS — M545 Low back pain: Secondary | ICD-10-CM | POA: Diagnosis not present

## 2018-03-05 DIAGNOSIS — M7918 Myalgia, other site: Secondary | ICD-10-CM | POA: Diagnosis not present

## 2018-03-05 DIAGNOSIS — R2681 Unsteadiness on feet: Secondary | ICD-10-CM | POA: Diagnosis not present

## 2018-03-05 DIAGNOSIS — M19011 Primary osteoarthritis, right shoulder: Secondary | ICD-10-CM | POA: Diagnosis not present

## 2018-03-05 DIAGNOSIS — Z79899 Other long term (current) drug therapy: Secondary | ICD-10-CM | POA: Diagnosis not present

## 2018-03-05 DIAGNOSIS — M4310 Spondylolisthesis, site unspecified: Secondary | ICD-10-CM | POA: Diagnosis not present

## 2018-03-05 DIAGNOSIS — M6281 Muscle weakness (generalized): Secondary | ICD-10-CM | POA: Diagnosis not present

## 2018-03-05 DIAGNOSIS — M47817 Spondylosis without myelopathy or radiculopathy, lumbosacral region: Secondary | ICD-10-CM | POA: Diagnosis not present

## 2018-03-06 DIAGNOSIS — C44319 Basal cell carcinoma of skin of other parts of face: Secondary | ICD-10-CM | POA: Diagnosis not present

## 2018-03-09 DIAGNOSIS — M159 Polyosteoarthritis, unspecified: Secondary | ICD-10-CM | POA: Diagnosis not present

## 2018-03-09 DIAGNOSIS — G8929 Other chronic pain: Secondary | ICD-10-CM | POA: Diagnosis not present

## 2018-03-09 DIAGNOSIS — I10 Essential (primary) hypertension: Secondary | ICD-10-CM | POA: Diagnosis not present

## 2018-03-09 DIAGNOSIS — M545 Low back pain: Secondary | ICD-10-CM | POA: Diagnosis not present

## 2018-03-09 DIAGNOSIS — M25511 Pain in right shoulder: Secondary | ICD-10-CM | POA: Diagnosis not present

## 2018-03-09 DIAGNOSIS — S01301A Unspecified open wound of right ear, initial encounter: Secondary | ICD-10-CM | POA: Diagnosis not present

## 2018-03-09 DIAGNOSIS — M25569 Pain in unspecified knee: Secondary | ICD-10-CM | POA: Diagnosis not present

## 2018-03-09 DIAGNOSIS — J301 Allergic rhinitis due to pollen: Secondary | ICD-10-CM | POA: Diagnosis not present

## 2018-03-09 DIAGNOSIS — M5416 Radiculopathy, lumbar region: Secondary | ICD-10-CM | POA: Diagnosis not present

## 2018-03-16 DIAGNOSIS — M25511 Pain in right shoulder: Secondary | ICD-10-CM | POA: Diagnosis not present

## 2018-03-16 DIAGNOSIS — M159 Polyosteoarthritis, unspecified: Secondary | ICD-10-CM | POA: Diagnosis not present

## 2018-03-16 DIAGNOSIS — M545 Low back pain: Secondary | ICD-10-CM | POA: Diagnosis not present

## 2018-03-16 DIAGNOSIS — J301 Allergic rhinitis due to pollen: Secondary | ICD-10-CM | POA: Diagnosis not present

## 2018-03-16 DIAGNOSIS — Z634 Disappearance and death of family member: Secondary | ICD-10-CM | POA: Diagnosis not present

## 2018-03-16 DIAGNOSIS — G8929 Other chronic pain: Secondary | ICD-10-CM | POA: Diagnosis not present

## 2018-03-16 DIAGNOSIS — M5416 Radiculopathy, lumbar region: Secondary | ICD-10-CM | POA: Diagnosis not present

## 2018-03-16 DIAGNOSIS — I10 Essential (primary) hypertension: Secondary | ICD-10-CM | POA: Diagnosis not present

## 2018-03-23 DIAGNOSIS — R7611 Nonspecific reaction to tuberculin skin test without active tuberculosis: Secondary | ICD-10-CM | POA: Diagnosis not present
